# Patient Record
Sex: Female | Born: 1980 | Hispanic: No | Marital: Single | Smoking: Never smoker
Health system: Southern US, Community
[De-identification: ages and names within clinical notes are randomized; demographics above are authoritative.]

---

## 2016-06-21 ENCOUNTER — Emergency Department (HOSPITAL_COMMUNITY): Payer: Self-pay

## 2016-06-21 ENCOUNTER — Encounter (HOSPITAL_COMMUNITY): Payer: Self-pay | Admitting: *Deleted

## 2016-06-21 ENCOUNTER — Emergency Department (HOSPITAL_COMMUNITY)
Admission: EM | Admit: 2016-06-21 | Discharge: 2016-06-21 | Disposition: A | Discharge status: Home / Self Care | Payer: Self-pay | Attending: Emergency Medicine | Admitting: Emergency Medicine

## 2016-06-21 DIAGNOSIS — Z3A16 16 weeks gestation of pregnancy: Secondary | ICD-10-CM | POA: Insufficient documentation

## 2016-06-21 DIAGNOSIS — W1839XA Other fall on same level, initial encounter: Secondary | ICD-10-CM | POA: Insufficient documentation

## 2016-06-21 DIAGNOSIS — O9A212 Injury, poisoning and certain other consequences of external causes complicating pregnancy, second trimester: Secondary | ICD-10-CM | POA: Insufficient documentation

## 2016-06-21 DIAGNOSIS — Y999 Unspecified external cause status: Secondary | ICD-10-CM | POA: Insufficient documentation

## 2016-06-21 DIAGNOSIS — Y929 Unspecified place or not applicable: Secondary | ICD-10-CM | POA: Insufficient documentation

## 2016-06-21 DIAGNOSIS — S46911A Strain of unspecified muscle, fascia and tendon at shoulder and upper arm level, right arm, initial encounter: Secondary | ICD-10-CM

## 2016-06-21 DIAGNOSIS — Y939 Activity, unspecified: Secondary | ICD-10-CM | POA: Insufficient documentation

## 2016-06-21 DIAGNOSIS — S46811A Strain of other muscles, fascia and tendons at shoulder and upper arm level, right arm, initial encounter: Secondary | ICD-10-CM | POA: Insufficient documentation

## 2016-06-21 LAB — PREGNANCY, URINE: PREG TEST UR: POSITIVE — AB

## 2016-06-21 IMAGING — CR DG SHOULDER 2+V*R*
4 series · 4 of 4 positions shown · non-contrast
Comparison: None.

CLINICAL DATA: Right shoulder injury with pain.  Initial encounter.

EXAM:
RIGHT SHOULDER - 2+ VIEW

[w shoulder external right]
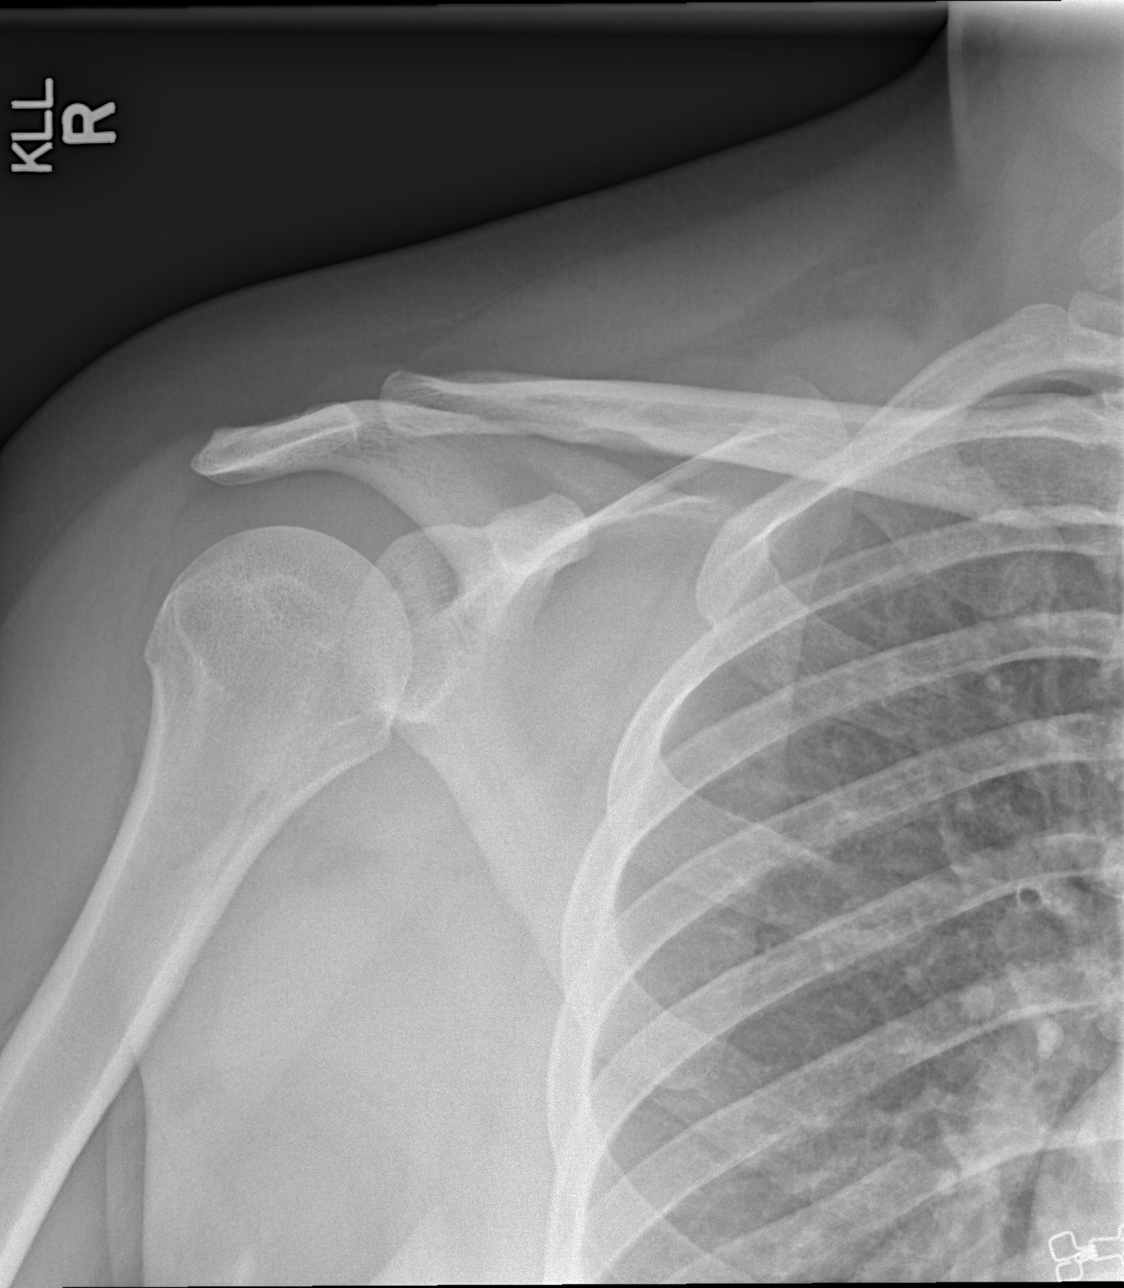

[w shoulder y-view right (1 of 2)]
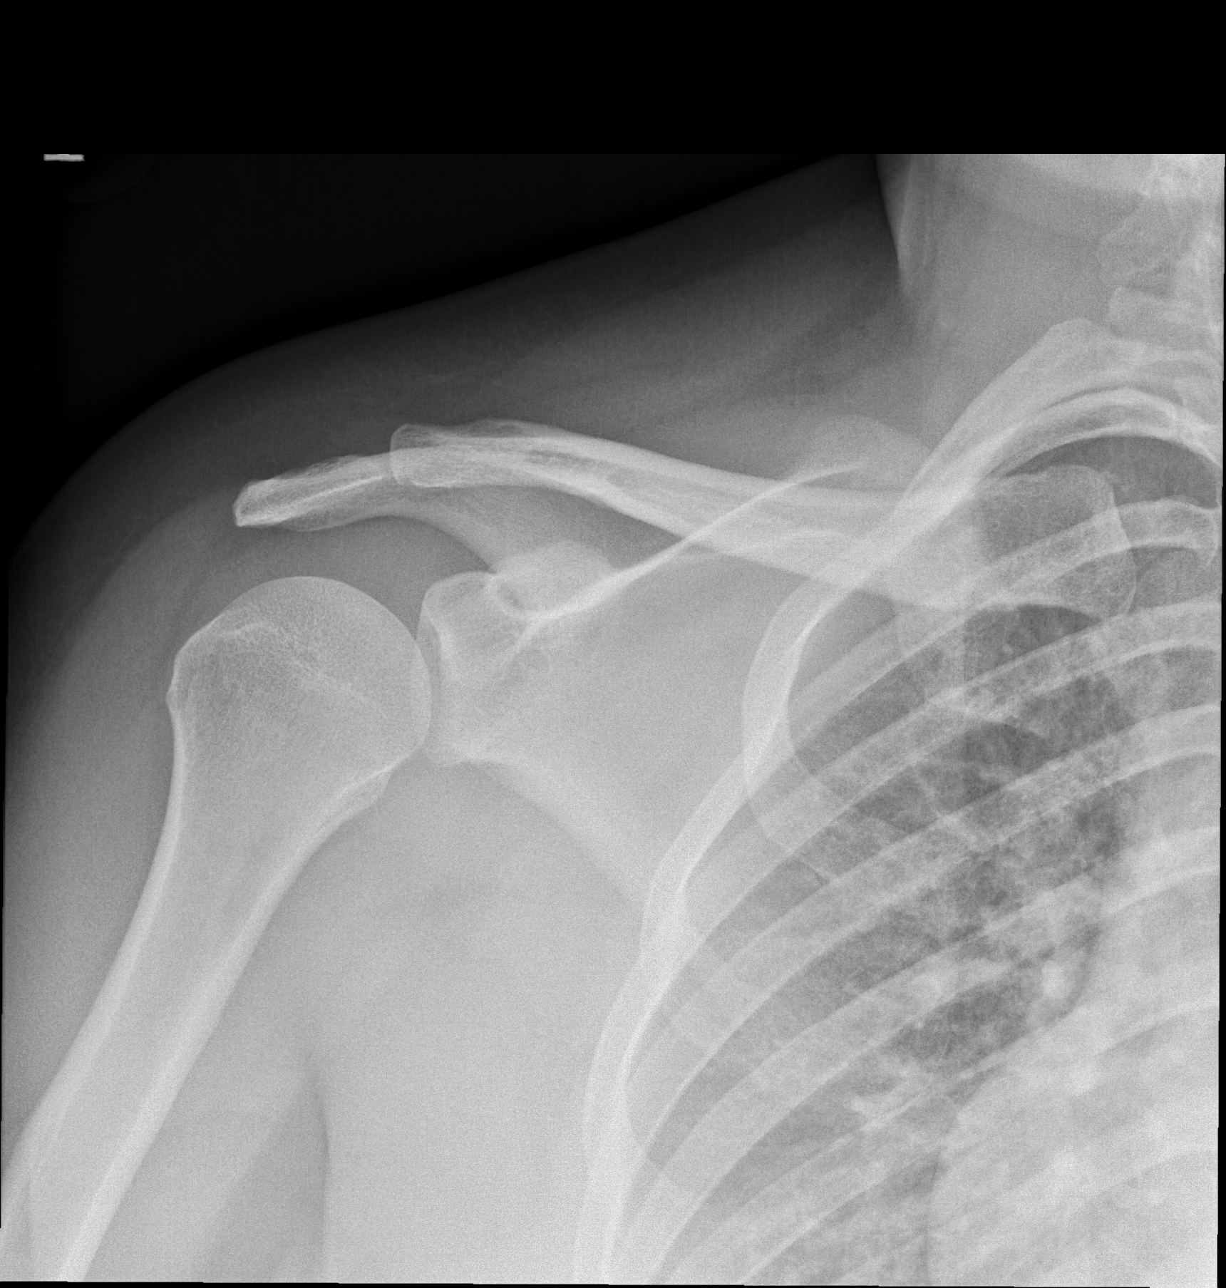

[w shoulder y-view right (2 of 2)]
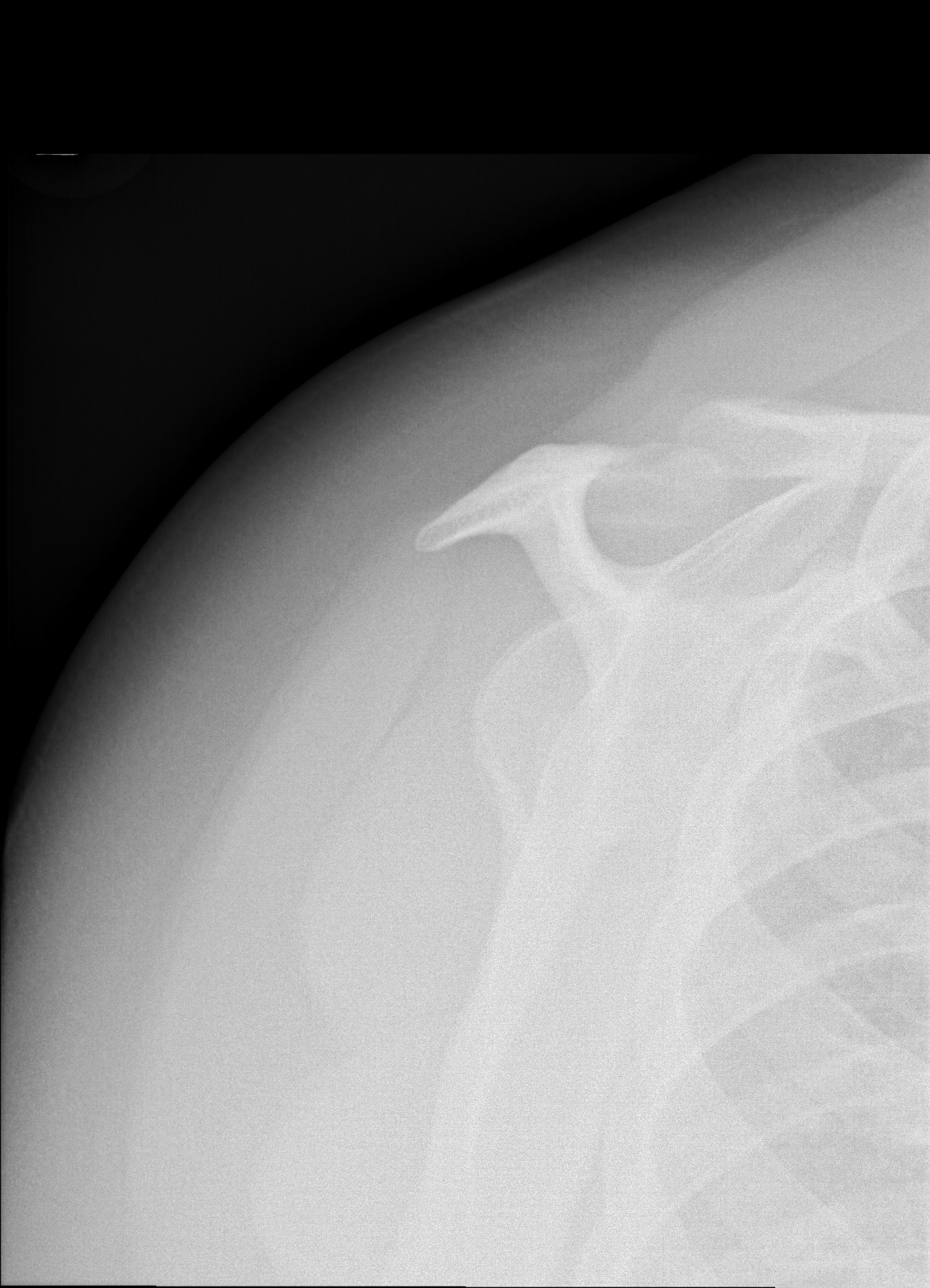

[x shoulder axillary right]
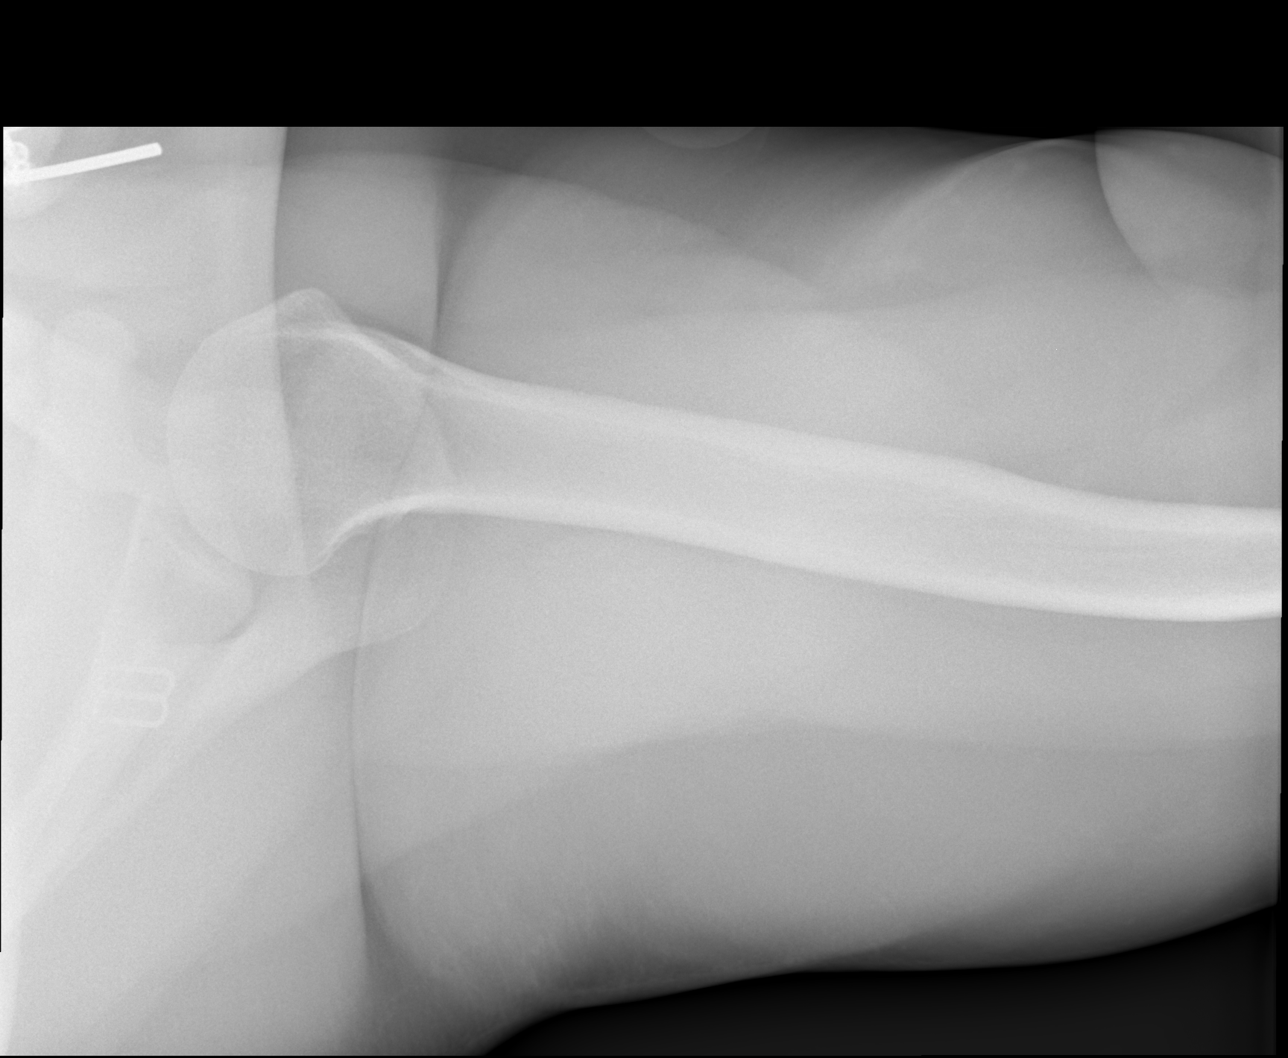

[4 of 4 positions shown; findings below may reference images not displayed]

FINDINGS: There is no evidence of fracture or dislocation. There is no
evidence of arthropathy or other focal bone abnormality. Soft
tissues are unremarkable.
IMPRESSION: Negative.

## 2016-06-21 MED ORDER — ACETAMINOPHEN 325 MG PO TABS
650.0000 mg | ORAL_TABLET | Freq: Once | ORAL | Status: AC
  Administered 2016-06-21: 650 mg via ORAL
  Filled 2016-06-21: qty 2

## 2016-06-21 NOTE — Discharge Instructions (Signed)
Muscle Strain °A muscle strain is an injury that occurs when a muscle is stretched beyond its normal length. Usually a small number of muscle fibers are torn when this happens. Muscle strain is rated in degrees. First-degree strains have the least amount of muscle fiber tearing and pain. Second-degree and third-degree strains have increasingly more tearing and pain.  °Usually, recovery from muscle strain takes 1-2 weeks. Complete healing takes 5-6 weeks.  °CAUSES  °Muscle strain happens when a sudden, violent force placed on a muscle stretches it too far. This may occur with lifting, sports, or a fall.  °RISK FACTORS °Muscle strain is especially common in athletes.  °SIGNS AND SYMPTOMS °At the site of the muscle strain, there may be: °· Pain. °· Bruising. °· Swelling. °· Difficulty using the muscle due to pain or lack of normal function. °DIAGNOSIS  °Your health care provider will perform a physical exam and ask about your medical history. °TREATMENT  °Often, the best treatment for a muscle strain is resting, icing, and applying cold compresses to the injured area.   °HOME CARE INSTRUCTIONS  °· Use the PRICE method of treatment to promote muscle healing during the first 2-3 days after your injury. The PRICE method involves: °· Protecting the muscle from being injured again. °· Restricting your activity and resting the injured body part. °· Icing your injury. To do this, put ice in a plastic bag. Place a towel between your skin and the bag. Then, apply the ice and leave it on from 15-20 minutes each hour. After the third day, switch to moist heat packs. °· Apply compression to the injured area with a splint or elastic bandage. Be careful not to wrap it too tightly. This may interfere with blood circulation or increase swelling. °· Elevate the injured body part above the level of your heart as often as you can. °· Only take over-the-counter or prescription medicines for pain, discomfort, or fever as directed by your  health care provider. °· Warming up prior to exercise helps to prevent future muscle strains. °SEEK MEDICAL CARE IF:  °· You have increasing pain or swelling in the injured area. °· You have numbness, tingling, or a significant loss of strength in the injured area. °MAKE SURE YOU:  °· Understand these instructions. °· Will watch your condition. °· Will get help right away if you are not doing well or get worse. °  °This information is not intended to replace advice given to you by your health care provider. Make sure you discuss any questions you have with your health care provider. °  °Document Released: 11/19/2005 Document Revised: 09/09/2013 Document Reviewed: 06/18/2013 °Elsevier Interactive Patient Education ©2016 [employer] Inc[employer] ° °Cryotherapy °Cryotherapy means treatment with cold. Ice or gel packs can be used to reduce both pain and swelling. Ice is the most helpful within the first 24 to 48 hours after an injury or flare-up from overusing a muscle or joint. Sprains, strains, spasms, burning pain, shooting pain, and aches can all be eased with ice. Ice can also be used when recovering from surgery. Ice is effective, has very few side effects, and is safe for most people to use. °PRECAUTIONS  °Ice is not a safe treatment option for people with: °· Raynaud phenomenon. This is a condition affecting small blood vessels in the extremities. Exposure to cold may cause your problems to return. °· Cold hypersensitivity. There are many forms of cold hypersensitivity, including: °¨ Cold urticaria. Red, itchy hives appear on the skin when the   tissues begin to warm after being iced. °¨ Cold erythema. This is a red, itchy rash caused by exposure to cold. °¨ Cold hemoglobinuria. Red blood cells break down when the tissues begin to warm after being iced. The hemoglobin that carry oxygen are passed into the urine because they cannot combine with blood proteins fast enough. °· Numbness or altered sensitivity in the area being  iced. °If you have any of the following conditions, do not use ice until you have discussed cryotherapy with your caregiver: °· Heart conditions, such as arrhythmia, angina, or chronic heart disease. °· High blood pressure. °· Healing wounds or open skin in the area being iced. °· Current infections. °· Rheumatoid arthritis. °· Poor circulation. °· Diabetes. °Ice slows the blood flow in the region it is applied. This is beneficial when trying to stop inflamed tissues from spreading irritating chemicals to surrounding tissues. However, if you expose your skin to cold temperatures for too long or without the proper protection, you can damage your skin or nerves. Watch for signs of skin damage due to cold. °HOME CARE INSTRUCTIONS °Follow these tips to use ice and cold packs safely. °· Place a dry or damp towel between the ice and skin. A damp towel will cool the skin more quickly, so you may need to shorten the time that the ice is used. °· For a more rapid response, add gentle compression to the ice. °· Ice for no more than 10 to 20 minutes at a time. The bonier the area you are icing, the less time it will take to get the benefits of ice. °· Check your skin after 5 minutes to make sure there are no signs of a poor response to cold or skin damage. °· Rest 20 minutes or more between uses. °· Once your skin is numb, you can end your treatment. You can test numbness by very lightly touching your skin. The touch should be so light that you do not see the skin dimple from the pressure of your fingertip. When using ice, most people will feel these normal sensations in this order: cold, burning, aching, and numbness. °· Do not use ice on someone who cannot communicate their responses to pain, such as small children or people with dementia. °HOW TO MAKE AN ICE PACK °Ice packs are the most common way to use ice therapy. Other methods include ice massage, ice baths, and cryosprays. Muscle creams that cause a cold, tingly  feeling do not offer the same benefits that ice offers and should not be used as a substitute unless recommended by your caregiver. °To make an ice pack, do one of the following: °· Place crushed ice or a bag of frozen vegetables in a sealable plastic bag. Squeeze out the excess air. Place this bag inside another plastic bag. Slide the bag into a pillowcase or place a damp towel between your skin and the bag. °· Mix 3 parts water with 1 part rubbing alcohol. Freeze the mixture in a sealable plastic bag. When you remove the mixture from the freezer, it will be slushy. Squeeze out the excess air. Place this bag inside another plastic bag. Slide the bag into a pillowcase or place a damp towel between your skin and the bag. °SEEK MEDICAL CARE IF: °· You develop white spots on your skin. This may give the skin a blotchy (mottled) appearance. °· Your skin turns blue or pale. °· Your skin becomes waxy or hard. °· Your swelling gets worse. °MAKE SURE   YOU:  °· Understand these instructions. °· Will watch your condition. °· Will get help right away if you are not doing well or get worse. °  °This information is not intended to replace advice given to you by your health care provider. Make sure you discuss any questions you have with your health care provider. °  °Document Released: 07/16/2011 Document Revised: 12/10/2014 Document Reviewed: 07/16/2011 °Elsevier Interactive Patient Education ©2016 Elsevier Inc. ° °

## 2016-06-21 NOTE — ED Notes (Signed)
Pt states that her laundry room ceiling drywall collapsed and struck her in the wet; pt states that the drywall was wet and heavy; pt denies LOC; pt denies neck or back pain; pt c/o headache and states that sound feels amplified; pt c/o rt shoulder discomfort; pt states that she fell against the washer and felt a sharp pain in her abdomen; pt denies abdominal pain currently but states "I think I may be pregnant so I was concerned"; denies vaginal bleeding

## 2016-06-21 NOTE — ED Provider Notes (Signed)
CSN: [id number]     Arrival date & time 06/21/16  1915 History   First MD Initiated Contact with Patient 06/21/16 2051     Chief Complaint  Patient presents with  . Head Injury     (Consider location/radiation/quality/duration/timing/severity/associated sxs/prior Treatment) HPI Comments: 35 year old female presents complaining of right-sided head pain after a piece of drywall fell onto her. Denies any loss of consciousness. No blurred vision. Does complain of some sharp right-sided neck pain is positional without radiation to her right arm. Last menstrual period was in March and she is concerned that she may be pregnant. Did have some transient abdominal discomfort after she fell against a washer that is since subsided. No treatment use prior to arrival. Denies any abdominal cramping or vaginal bleeding. Continues to endorse right shoulder pain is worse with movement better with rest  Patient is a 35 y.o. female presenting with head injury. The history is provided by the patient.  Head Injury   History reviewed. No pertinent past medical history. Past Surgical History  Procedure Laterality Date  . Cesarean section     No family history on file. Social History  Substance Use Topics  . Smoking status: Never Smoker   . Smokeless tobacco: None  . Alcohol Use: No   OB History    No data available     Review of Systems  All other systems reviewed and are negative.     Allergies  Asa  Home Medications   Prior to Admission medications   Not on File   BP 119/75 mmHg  Pulse 83  Temp(Src) 98.2 F (36.8 C) (Oral)  Resp 18  Ht 5\' 3"  (1.6 m)  Wt 129.275 kg  BMI 50.50 kg/m2  SpO2 99%  LMP 02/20/2016 Physical Exam  Constitutional: She is oriented to person, place, and time. She appears well-developed and well-nourished.  Non-toxic appearance. No distress.  HENT:  Head: Normocephalic and atraumatic.  Eyes: Conjunctivae, EOM and lids are normal. Pupils are equal, round, and  reactive to light.  Neck: Normal range of motion. Neck supple. No tracheal deviation present. No thyroid mass present.    Cardiovascular: Normal rate, regular rhythm and normal heart sounds.  Exam reveals no gallop.   No murmur heard. Pulmonary/Chest: Effort normal and breath sounds normal. No stridor. No respiratory distress. She has no decreased breath sounds. She has no wheezes. She has no rhonchi. She has no rales.  Abdominal: Soft. Normal appearance and bowel sounds are normal. She exhibits no distension. There is no tenderness. There is no rebound and no CVA tenderness.  Musculoskeletal: Normal range of motion. She exhibits no edema or tenderness.       Arms: Neurological: She is alert and oriented to person, place, and time. She has normal strength. No cranial nerve deficit or sensory deficit. GCS eye subscore is 4. GCS verbal subscore is 5. GCS motor subscore is 6.  Skin: Skin is warm and dry. No abrasion and no rash noted.  Psychiatric: She has a normal mood and affect. Her speech is normal and behavior is normal.  Nursing note and vitals reviewed.   ED Course  Procedures (including critical care time) Labs Review Labs Reviewed  PREGNANCY, URINE    Imaging Review No results found. I have personally reviewed and evaluated these images and lab results as part of my medical decision-making.   EKG Interpretation None      MDM   Final diagnoses:  None  Shoulder x-ray negative. Given Tylenol for  pain.    Lorre Nick, MD 06/21/16 2214

## 2016-10-22 ENCOUNTER — Encounter: Payer: Self-pay | Admitting: Obstetrics & Gynecology

## 2016-10-22 ENCOUNTER — Ambulatory Visit (INDEPENDENT_AMBULATORY_CARE_PROVIDER_SITE_OTHER): Payer: Self-pay | Admitting: Obstetrics & Gynecology

## 2016-10-22 VITALS — BP 136/80 | HR 82 | Wt 305.0 lb

## 2016-10-22 DIAGNOSIS — O99213 Obesity complicating pregnancy, third trimester: Secondary | ICD-10-CM

## 2016-10-22 DIAGNOSIS — O99210 Obesity complicating pregnancy, unspecified trimester: Secondary | ICD-10-CM

## 2016-10-22 DIAGNOSIS — O24913 Unspecified diabetes mellitus in pregnancy, third trimester: Secondary | ICD-10-CM

## 2016-10-22 DIAGNOSIS — O34219 Maternal care for unspecified type scar from previous cesarean delivery: Secondary | ICD-10-CM

## 2016-10-22 DIAGNOSIS — Z3493 Encounter for supervision of normal pregnancy, unspecified, third trimester: Secondary | ICD-10-CM

## 2016-10-22 DIAGNOSIS — Z113 Encounter for screening for infections with a predominantly sexual mode of transmission: Secondary | ICD-10-CM

## 2016-10-22 DIAGNOSIS — O24419 Gestational diabetes mellitus in pregnancy, unspecified control: Secondary | ICD-10-CM

## 2016-10-22 DIAGNOSIS — Z124 Encounter for screening for malignant neoplasm of cervix: Secondary | ICD-10-CM

## 2016-10-22 DIAGNOSIS — O0933 Supervision of pregnancy with insufficient antenatal care, third trimester: Secondary | ICD-10-CM

## 2016-10-22 DIAGNOSIS — Z1151 Encounter for screening for human papillomavirus (HPV): Secondary | ICD-10-CM

## 2016-10-22 LAB — POCT URINALYSIS DIP (DEVICE)
Bilirubin Urine: NEGATIVE
GLUCOSE, UA: NEGATIVE mg/dL
Hgb urine dipstick: NEGATIVE
Leukocytes, UA: NEGATIVE
Nitrite: NEGATIVE
PH: 6.5 (ref 5.0–8.0)
PROTEIN: NEGATIVE mg/dL
Specific Gravity, Urine: 1.025 (ref 1.005–1.030)
Urobilinogen, UA: 0.2 mg/dL (ref 0.0–1.0)

## 2016-10-22 NOTE — Progress Notes (Signed)
1 hr gtt @ 203

## 2016-10-22 NOTE — Patient Instructions (Signed)
AREA PEDIATRIC/FAMILY PRACTICE PHYSICIANS  [facility] [address], [address] [place], [place]  [address] Phone - [phone]   Fax - [phone]  [facility] [address] [place], [place] [address] Phone - [phone]   Fax - [phone]  [facility] [address] [place], [place]  [address] Phone - [phone]   Fax - [phone]  [facility] [address] [place], [place]  [address] Phone - [phone]   Fax - [phone]  [facility] [address] [place], [place]  [address] Phone - [phone]   Fax - [phone]  [facility] [address] [place], [place]  [address] Phone - [phone]   Fax - [phone]  [facility] [address] [place], [place]  [address] Phone - [phone]   Fax - [phone]  [facility] [address] [place], [place]  [address] Phone - [phone]   Fax - [phone]  [facility] [address] [place], [place]  [address] Phone - [phone]   Fax - [phone] [facility] [address] [place], [place]  [address] Phone - [phone]   Fax - [phone]  [facility] [address] [place], [place]  [address] Phone - [phone]   Fax - [phone]  [facility] [address] [place], [place]  [address] Phone - [phone]   Fax - [phone]  [facility] [address] [place], [place]  [address] Phone - [phone]   Fax - [phone]  [facility] [address] [place], [place]  [address] Phone - [phone]  [facility] [address] [place], [place]  [address] Phone - [phone]   Fax - [phone]  [facility] [address] [place], [place]  [address] Phone - [phone]   Fax - [phone]  [facility] [address] [place], [place]  [address] Phone - [phone]   Fax - [phone]  [facility] [address] [place], [place]  [address] Phone - [phone]   Fax - [phone]   [facility] [address] [place], [place]  [address] Phone - [phone]   Fax - [phone]  [facility] [address] [place], [place]  [address] Phone - [phone]   Fax - [phone]  [facility] [address] [place], [place]  [address] Phone - [phone]   Fax - [phone]  [facility] [address] , [place]  [address] Phone - [phone]   Fax - [phone]  [facility] [address] , [place]  [address] Phone - [phone]   Fax - [phone] [facility] [address] [place], [place]  [address] Phone - [phone]   Fax - [phone]  [facility] [address] [place], [place]  [address] Phone - [phone]   Fax - [phone]  [facility] [address] [place],   [address] Phone - [phone]   Fax - [phone]   

## 2016-10-22 NOTE — Progress Notes (Signed)
  Subjective:    Renee Dennis is a [id number] 2024w0d being seen today for her first obstetrical visit.  Her obstetrical history is significant for previous cesarean sections, obesity, late prenatal care. Patient does intend to breast feed. Pregnancy history fully reviewed.  Patient reports no complaints.  Vitals:   10/22/16 1259  BP: 136/80  Pulse: 82  Weight: (!) 138.3 kg (305 lb)    HISTORY: OB History  Gravida Para Term Preterm AB Living  5 3 3  0 1 3  SAB TAB Ectopic Multiple Live Births  1 0 0 0 3    # Outcome Date GA Lbr Len/2nd Weight Sex Delivery Anes PTL Lv  5 Current           4 Term 05/22/14 5773w0d  3.685 kg (8 lb 2 oz) M CS-Unspec Spinal N LIV  3 Term 12/15/08 858w0d  2.977 kg (6 lb 9 oz) F CS-Unspec Spinal N LIV  2 Term 12/15/03 138w0d  3.402 kg (7 lb 8 oz) M CS-Unspec EPI N LIV     Birth Comments: patient had c-section for NR FHR  1 SAB 03/2001             History reviewed. No pertinent past medical history. Past Surgical History:  Procedure Laterality Date  . CESAREAN SECTION     Family History  Problem Relation Age of Onset  . Diabetes Maternal Grandmother   . Stroke Maternal Grandfather   . Heart disease Maternal Grandfather      Exam    Uterus:     Pelvic Exam:    Perineum: No Hemorrhoids   Vulva: normal   Vagina:  normal mucosa   pH:    Cervix: no lesions   Adnexa: not evaluated   Bony Pelvis: average  System: Breast:     Skin: normal coloration and turgor, no rashes    Neurologic: oriented, normal mood   Extremities: normal strength, tone, and muscle mass   HEENT PERRLA   Mouth/Teeth mucous membranes moist, pharynx normal without lesions and dental hygiene good   Neck supple   Cardiovascular: regular rate and rhythm   Respiratory:  appears well, vitals normal, no respiratory distress, acyanotic, normal RR, neck free of mass or lymphadenopathy, chest clear, no wheezing, crepitations, rhonchi, normal symmetric air entry   Abdomen: obese,  gravid   Urinary: urethral meatus normal      Assessment:    Pregnancy: [id number] Patient Active Problem List   Diagnosis Date Noted  . Encounter for supervision of low-risk pregnancy in third trimester 10/22/2016  . Previous cesarean section complicating pregnancy 10/22/2016  . Maternal morbid obesity, antepartum (HCC) 10/22/2016        Plan:     Initial labs drawn. Prenatal vitamins. Problem list reviewed and updated. Genetic Screening too late  Ultrasound discussed; fetal survey: ordered.  Follow up in 2 weeks. 50% of 30 min visit spent on counseling and coordination of care.  1 hr GTT today Repeat CS at 39 weeks, wants BTL   Renee Dennis 10/22/2016

## 2016-10-23 LAB — PAIN MGMT, PROFILE 6 CONF W/O MM, U
6 ACETYLMORPHINE: NEGATIVE ng/mL (ref <10)
ALCOHOL METABOLITES: NEGATIVE ng/mL (ref <500)
Amphetamines: NEGATIVE ng/mL (ref <500)
Barbiturates: NEGATIVE ng/mL (ref <300)
Benzodiazepines: NEGATIVE ng/mL (ref <100)
COCAINE METABOLITE: NEGATIVE ng/mL (ref <150)
Creatinine: 195.3 mg/dL (ref >=20.0)
METHADONE METABOLITE: NEGATIVE ng/mL (ref <100)
Marijuana Metabolite: NEGATIVE ng/mL (ref <20)
OXYCODONE: NEGATIVE ng/mL (ref <100)
Opiates: NEGATIVE ng/mL (ref <100)
Oxidant: NEGATIVE ug/mL (ref <200)
PH: 6.87 (ref 4.5–9.0)
PHENCYCLIDINE: NEGATIVE ng/mL (ref <25)
PLEASE NOTE: 0

## 2016-10-23 LAB — CULTURE, OB URINE

## 2016-10-23 LAB — PRENATAL PROFILE (SOLSTAS)
ANTIBODY SCREEN: NEGATIVE
Basophils Absolute: 0 cells/uL (ref 0–200)
Basophils Relative: 0 %
EOS PCT: 1 %
Eosinophils Absolute: 84 cells/uL (ref 15–500)
HEMATOCRIT: 33.5 % — AB (ref 35.0–45.0)
HEMOGLOBIN: 10.8 g/dL — AB (ref 11.7–15.5)
HEP B S AG: NEGATIVE
HIV 1&2 Ab, 4th Generation: NONREACTIVE
LYMPHS ABS: 2436 {cells}/uL (ref 850–3900)
LYMPHS PCT: 29 %
MCH: 24.5 pg — ABNORMAL LOW (ref 27.0–33.0)
MCHC: 32.2 g/dL (ref 32.0–36.0)
MCV: 76 fL — ABNORMAL LOW (ref 80.0–100.0)
MPV: 11.1 fL (ref 7.5–12.5)
Monocytes Absolute: 1008 cells/uL — ABNORMAL HIGH (ref 200–950)
Monocytes Relative: 12 %
NEUTROS PCT: 58 %
Neutro Abs: 4872 cells/uL (ref 1500–7800)
Platelets: 274 10*3/uL (ref 140–400)
RBC: 4.41 MIL/uL (ref 3.80–5.10)
RDW: 16 % — ABNORMAL HIGH (ref 11.0–15.0)
RH TYPE: POSITIVE
Rubella: 2.66 Index — ABNORMAL HIGH (ref <0.90)
WBC: 8.4 10*3/uL (ref 3.8–10.8)

## 2016-10-23 LAB — GLUCOSE TOLERANCE, 1 HOUR (50G) W/O FASTING: GLUCOSE, 1 HR, GESTATIONAL: 85 mg/dL (ref <140)

## 2016-10-24 LAB — CYTOLOGY - PAP
CHLAMYDIA, DNA PROBE: NEGATIVE
DIAGNOSIS: NEGATIVE
HPV: NOT DETECTED
NEISSERIA GONORRHEA: NEGATIVE

## 2016-10-26 LAB — HEMOGLOBINOPATHY EVALUATION
HCT: 33.5 % — ABNORMAL LOW (ref 35.0–45.0)
HGB A: 97 % (ref >96.0)
Hemoglobin: 10.8 g/dL — ABNORMAL LOW (ref 11.7–15.5)
Hgb A2 Quant: 2 % (ref 1.8–3.5)
MCH: 24.5 pg — ABNORMAL LOW (ref 27.0–33.0)
MCV: 76 fL — ABNORMAL LOW (ref 80.0–100.0)
RBC: 4.41 MIL/uL (ref 3.80–5.10)
RDW: 16 % — ABNORMAL HIGH (ref 11.0–15.0)

## 2016-10-29 ENCOUNTER — Ambulatory Visit (HOSPITAL_COMMUNITY)
Admission: RE | Admit: 2016-10-29 | Discharge: 2016-10-29 | Disposition: A | Discharge status: Home / Self Care | Payer: Self-pay | Source: Ambulatory Visit | Attending: Obstetrics & Gynecology | Admitting: Obstetrics & Gynecology

## 2016-10-29 ENCOUNTER — Other Ambulatory Visit: Payer: Self-pay | Admitting: Obstetrics & Gynecology

## 2016-10-29 DIAGNOSIS — Z3A35 35 weeks gestation of pregnancy: Secondary | ICD-10-CM

## 2016-10-29 DIAGNOSIS — O0933 Supervision of pregnancy with insufficient antenatal care, third trimester: Secondary | ICD-10-CM

## 2016-10-29 DIAGNOSIS — O34211 Maternal care for low transverse scar from previous cesarean delivery: Secondary | ICD-10-CM | POA: Insufficient documentation

## 2016-10-29 DIAGNOSIS — O34219 Maternal care for unspecified type scar from previous cesarean delivery: Secondary | ICD-10-CM

## 2016-10-29 DIAGNOSIS — Z363 Encounter for antenatal screening for malformations: Secondary | ICD-10-CM | POA: Insufficient documentation

## 2016-10-29 DIAGNOSIS — Z3493 Encounter for supervision of normal pregnancy, unspecified, third trimester: Secondary | ICD-10-CM

## 2016-10-29 DIAGNOSIS — Z3687 Encounter for antenatal screening for uncertain dates: Secondary | ICD-10-CM | POA: Insufficient documentation

## 2016-10-29 IMAGING — US US MFM OB COMP +14 WKS
1 series · 14 of 28 positions shown · non-contrast
Comparison: none

[Series 1: us mfm ob comp +14 wks · 75 acquisitions, 14 frames shown]
[im 3/75]
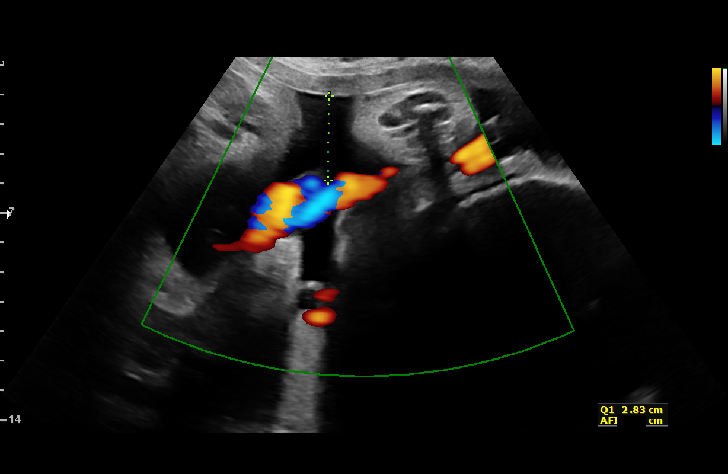
[im 9/75]
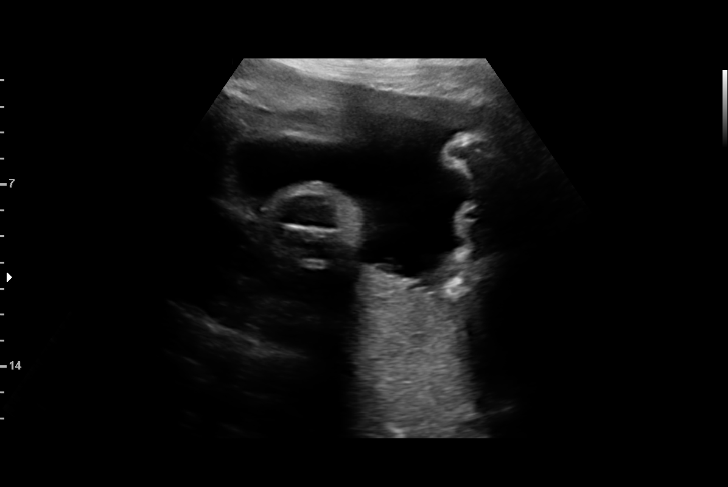
[im 14/75]
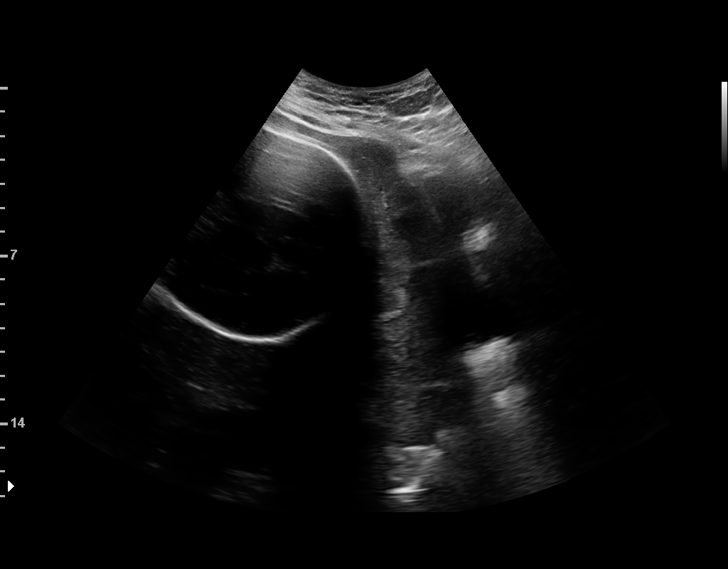
[im 20/75]
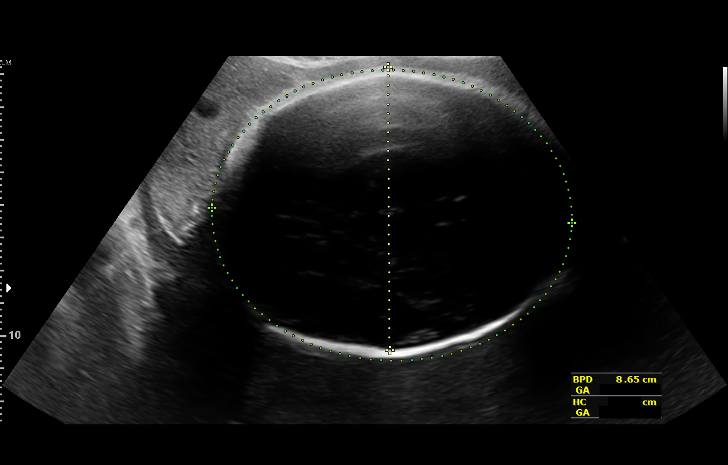
[im 25/75]
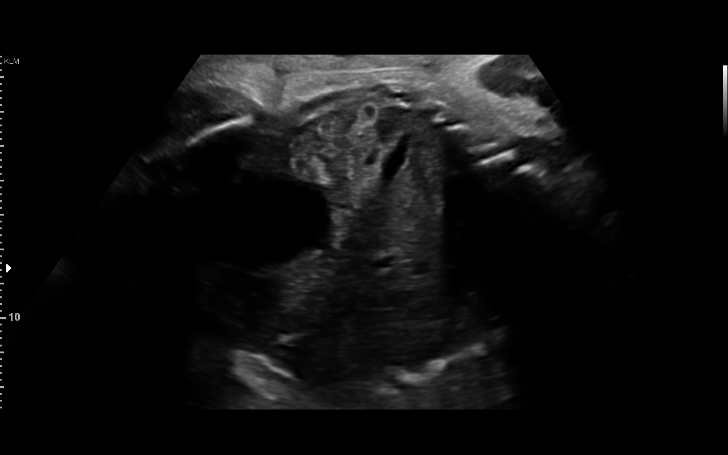
[im 31/75]
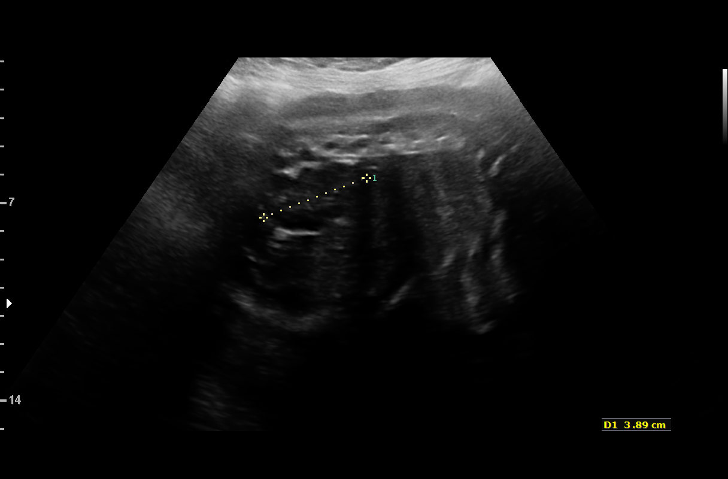
[im 36/75]
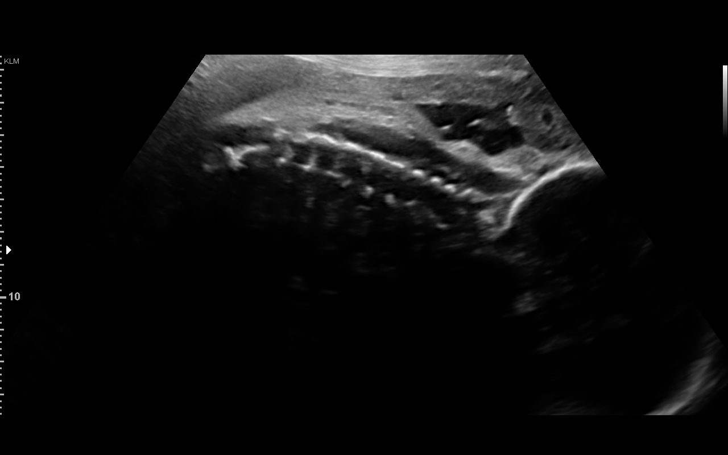
[im 42/75]
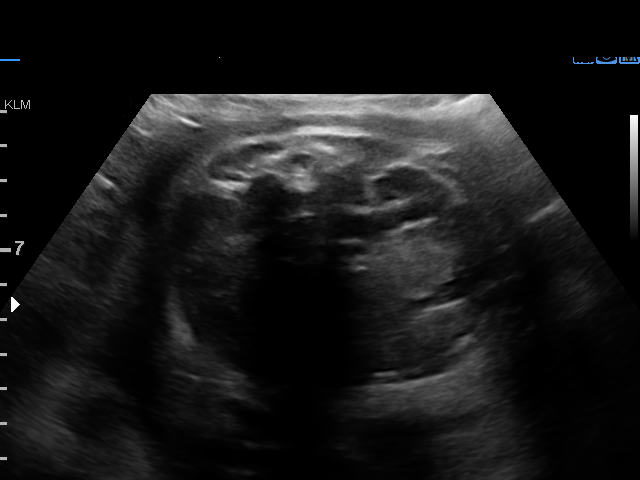
[im 47/75]
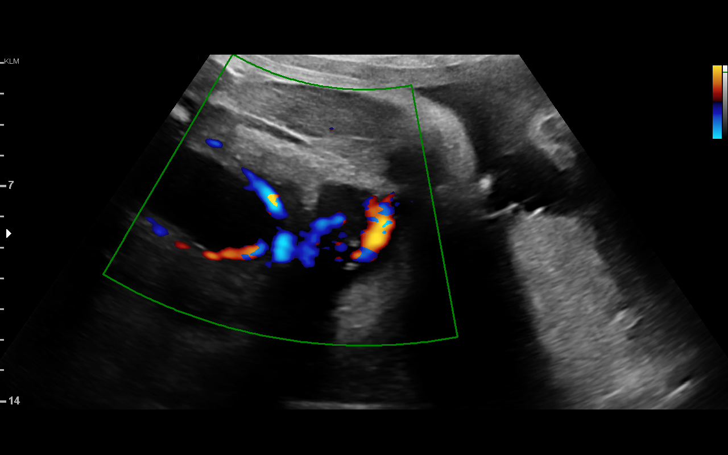
[im 53/75]
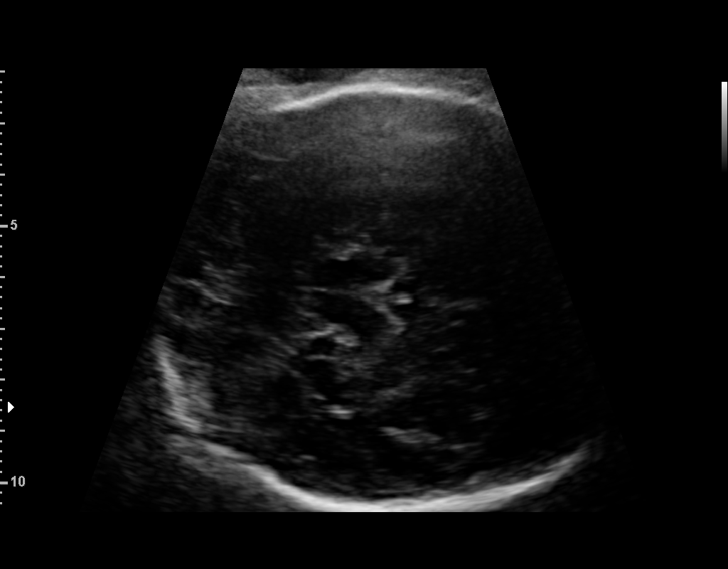
[im 58/75]
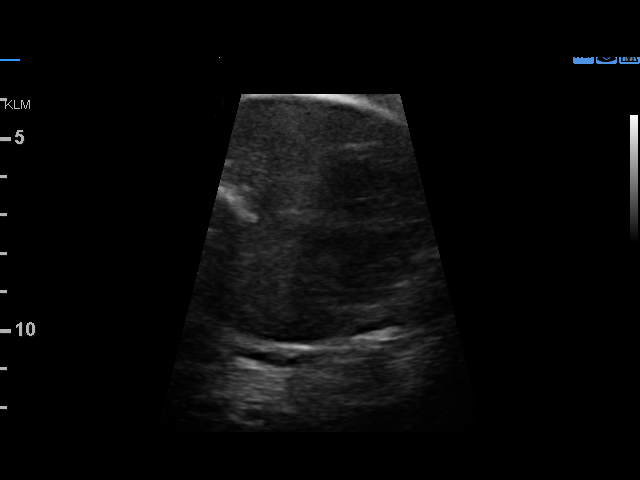
[im 64/75]
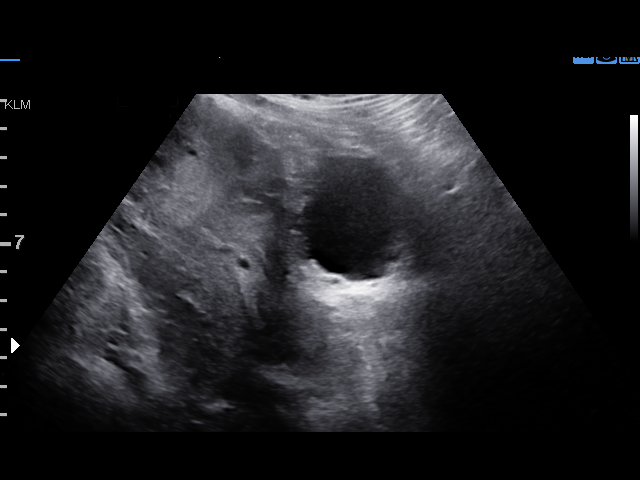
[im 69/75]
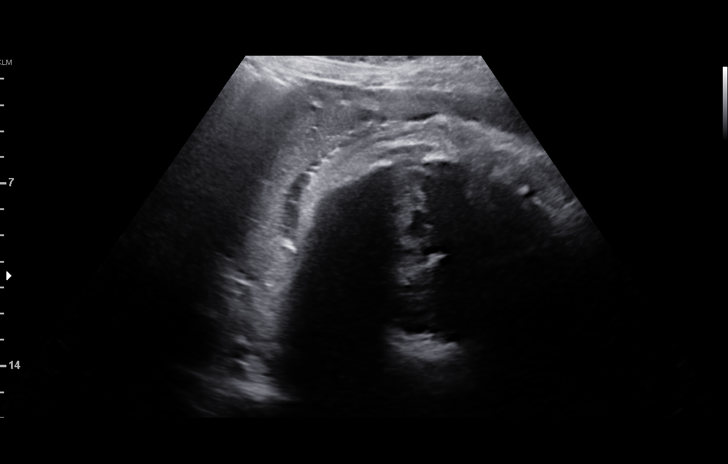
[im 75/75]
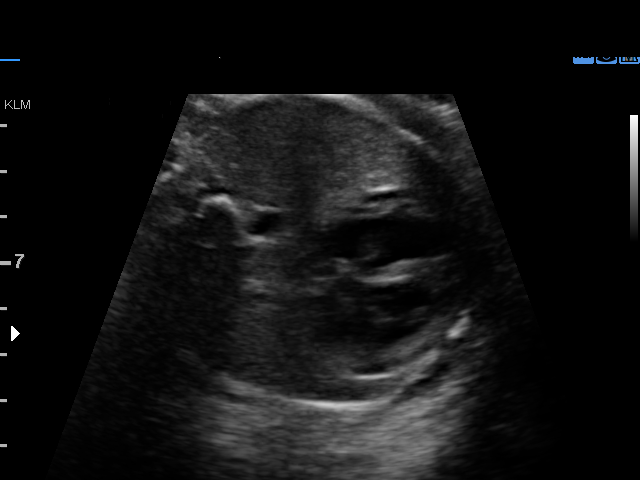

[14 of 28 positions shown; findings below may reference images not displayed]

1  KINSEY TORRE             [id number]      [id number]     [id number]
Indications

35 weeks gestation of pregnancy
Encounter for antenatal screening for
malformations
Late to prenatal care, third trimester
Encounter for uncertain dates
Previous cesarean delivery, antepartum x 3
OB History

Gravidity:    5         Term:   3        Prem:   0        SAB:   1
TOP:          0       Ectopic:  0        Living: 3
Fetal Evaluation

Num Of Fetuses:     1
Fetal Heart         154
Rate(bpm):
Cardiac Activity:   Observed
Presentation:       Cephalic
Placenta:           Posterior Previa
P. Cord Insertion:  Not well visualized

Amniotic Fluid
AFI FV:      Subjectively within normal limits

AFI Sum(cm)     %Tile       Largest Pocket(cm)
12.49           40

RUQ(cm)       RLQ(cm)       LUQ(cm)        LLQ(cm)
2.83
Biometry

BPD:        85  mm     G. Age:  34w 1d         19  %    CI:        73.02   %    70 - 86
FL/HC:      21.7   %    20.1 -
HC:      316.2  mm     G. Age:  35w 4d         19  %    HC/AC:      0.95        0.93 -
AC:      332.4  mm     G. Age:  37w 1d         92  %    FL/BPD:     80.6   %    71 - 87
FL:       68.5  mm     G. Age:  35w 1d         34  %    FL/AC:      20.6   %    20 - 24

Est. FW:    9989  gm      6 lb 5 oz     75  %
Gestational Age

LMP:           34w 0d        Date:  03/05/16                 EDD:   12/10/16
U/S Today:     35w 4d                                        EDD:   11/29/16
Best:          35w 4d     Det. By:  U/S (10/29/16)           EDD:   11/29/16
Anatomy

Cranium:               Appears normal         Aortic Arch:            Not well visualized
Cavum:                 Appears normal         Ductal Arch:            Not well visualized
Ventricles:            Appears normal         Diaphragm:              Appears normal
Choroid Plexus:        Appears normal         Stomach:                Appears normal, left
sided
Cerebellum:            Appears normal         Abdomen:                Appears normal
Posterior Fossa:       Appears normal         Abdominal Wall:         Not well visualized
Nuchal Fold:           Not applicable (>20    Cord Vessels:           Appears normal (3
wks GA)                                        vessel cord)
Face:                  Profile nl; orbits not Kidneys:                Appear normal
well visualized
Lips:                  Appears normal         Bladder:                Appears normal
Thoracic:              Appears normal         Spine:                  Appears normal
Heart:                 Not well visualized    Upper Extremities:      Not well visualized
RVOT:                  Appears normal         Lower Extremities:      Not well visualized
LVOT:                  Appears normal

Other:  Fetus appears to be a male. Technically difficult due to advanced GA
and fetal position.
Cervix Uterus Adnexa

Cervix
Not visualized (advanced GA >13wks)
Impression

Single IUP at 35w 4d
Late prenatal care, uncertain dates
Somewhat limited views of the fetal anatomy were obtained
due to late gestational age and fetal position
The estimated fetal weight is at the 75th %tile
A complete, posterior placenta previa is noted
Normal amniotic fluid volume
Recommendations

Consider Ceserean delivery at 36-37 weeks due to complete
placenta previa (or earlier based on the clinical scenario)
Would offer a course of late preterm [place] given
uncertain dates

## 2016-11-06 ENCOUNTER — Ambulatory Visit (INDEPENDENT_AMBULATORY_CARE_PROVIDER_SITE_OTHER): Payer: Self-pay | Admitting: Obstetrics and Gynecology

## 2016-11-06 VITALS — BP 132/78 | HR 84 | Wt 308.0 lb

## 2016-11-06 DIAGNOSIS — O99213 Obesity complicating pregnancy, third trimester: Secondary | ICD-10-CM

## 2016-11-06 DIAGNOSIS — O99210 Obesity complicating pregnancy, unspecified trimester: Secondary | ICD-10-CM

## 2016-11-06 DIAGNOSIS — O24913 Unspecified diabetes mellitus in pregnancy, third trimester: Secondary | ICD-10-CM

## 2016-11-06 DIAGNOSIS — O34219 Maternal care for unspecified type scar from previous cesarean delivery: Secondary | ICD-10-CM

## 2016-11-06 DIAGNOSIS — O4403 Placenta previa specified as without hemorrhage, third trimester: Secondary | ICD-10-CM

## 2016-11-06 DIAGNOSIS — Z3493 Encounter for supervision of normal pregnancy, unspecified, third trimester: Secondary | ICD-10-CM

## 2016-11-06 DIAGNOSIS — O4400 Placenta previa specified as without hemorrhage, unspecified trimester: Secondary | ICD-10-CM

## 2016-11-06 MED ORDER — BETAMETHASONE SOD PHOS & ACET 6 (3-3) MG/ML IJ SUSP
12.0000 mg | Freq: Every day | INTRAMUSCULAR | Status: AC
  Administered 2016-11-06: 12 mg via INTRAMUSCULAR

## 2016-11-06 NOTE — Progress Notes (Signed)
   PRENATAL VISIT NOTE  Subjective:  Renee Dennis is a 35 y.o. [id number] at 554w1d being seen today for ongoing prenatal care.  She is currently monitored for the following issues for this high-risk pregnancy and has Encounter for supervision of low-risk pregnancy in third trimester; Previous cesarean section complicating pregnancy; Maternal morbid obesity, antepartum (HCC); and Placenta previa antepartum on her problem list.  Patient reports no complaints.  Contractions: Irritability. Vag. Bleeding: None.  Movement: Present. Denies leaking of fluid.   The following portions of the patient's history were reviewed and updated as appropriate: allergies, current medications, past family history, past medical history, past social history, past surgical history and problem list. Problem list updated.  Objective:   Vitals:   11/06/16 1538  BP: 132/78  Pulse: 84  Weight: (!) 308 lb (139.7 kg)    Fetal Status: Fetal Heart Rate (bpm): 138 Fundal Height: 35 cm Movement: Present     General:  Alert, oriented and cooperative. Patient is in no acute distress.  Skin: Skin is warm and dry. No rash noted.   Cardiovascular: Normal heart rate noted  Respiratory: Normal respiratory effort, no problems with respiration noted  Abdomen: Soft, gravid, appropriate for gestational age. Pain/Pressure: Present     Pelvic:  Cervical exam deferred        Extremities: Normal range of motion.  Edema: None  Mental Status: Normal mood and affect. Normal behavior. Normal judgment and thought content.   Assessment and Plan:  Pregnancy: A5W0981G5P3013 at 184w1d  1. Encounter for supervision of low-risk pregnancy in third trimester Patient is doing well without complaints Results of anatomy ultrasound reviewed with the patient. Patient informed of posterior placenta previa. Pelvic rest precautions discussed.  Will plan for delivery at 36-37 weeks as recommended. BMZ today and tomorrow  2. Maternal morbid obesity, antepartum  (HCC)   3. Previous cesarean section complicating pregnancy Will be scheduled for repeat  4. Placenta previa antepartum Bleeding precautions also reviewed  Preterm labor symptoms and general obstetric precautions including but not limited to vaginal bleeding, contractions, leaking of fluid and fetal movement were reviewed in detail with the patient. Please refer to After Visit Summary for other counseling recommendations.  Return in about 1 week (around 11/13/2016).   Catalina AntiguaPeggy Kenyatte Gruber, MD

## 2016-11-06 NOTE — Addendum Note (Signed)
Addended by: Sherre LainASH, AMANDA A on: 11/06/2016 04:25 PM   Modules accepted: Orders

## 2016-11-07 ENCOUNTER — Ambulatory Visit (INDEPENDENT_AMBULATORY_CARE_PROVIDER_SITE_OTHER): Payer: Self-pay

## 2016-11-07 ENCOUNTER — Encounter (HOSPITAL_COMMUNITY): Payer: Self-pay | Admitting: *Deleted

## 2016-11-07 DIAGNOSIS — O4403 Placenta previa specified as without hemorrhage, third trimester: Secondary | ICD-10-CM

## 2016-11-07 DIAGNOSIS — O09213 Supervision of pregnancy with history of pre-term labor, third trimester: Secondary | ICD-10-CM

## 2016-11-07 MED ORDER — BETAMETHASONE SOD PHOS & ACET 6 (3-3) MG/ML IJ SUSP
12.0000 mg | Freq: Once | INTRAMUSCULAR | Status: AC
  Administered 2016-11-07: 12 mg via INTRAMUSCULAR

## 2016-11-07 NOTE — Progress Notes (Signed)
Pt here today for 2nd dose of betamethasone.  Pt tolerated well. Pt has no questions at this time.

## 2016-11-08 ENCOUNTER — Ambulatory Visit: Payer: Self-pay

## 2016-11-12 ENCOUNTER — Encounter (HOSPITAL_COMMUNITY): Payer: Self-pay

## 2016-11-16 ENCOUNTER — Ambulatory Visit (INDEPENDENT_AMBULATORY_CARE_PROVIDER_SITE_OTHER): Payer: Self-pay | Admitting: Obstetrics and Gynecology

## 2016-11-16 VITALS — BP 120/75 | HR 86 | Wt 309.6 lb

## 2016-11-16 DIAGNOSIS — O99213 Obesity complicating pregnancy, third trimester: Secondary | ICD-10-CM

## 2016-11-16 DIAGNOSIS — O4403 Placenta previa specified as without hemorrhage, third trimester: Secondary | ICD-10-CM

## 2016-11-16 DIAGNOSIS — O34219 Maternal care for unspecified type scar from previous cesarean delivery: Secondary | ICD-10-CM

## 2016-11-16 DIAGNOSIS — O99210 Obesity complicating pregnancy, unspecified trimester: Secondary | ICD-10-CM

## 2016-11-16 DIAGNOSIS — O4400 Placenta previa specified as without hemorrhage, unspecified trimester: Secondary | ICD-10-CM

## 2016-11-16 DIAGNOSIS — O24913 Unspecified diabetes mellitus in pregnancy, third trimester: Secondary | ICD-10-CM

## 2016-11-16 DIAGNOSIS — Z113 Encounter for screening for infections with a predominantly sexual mode of transmission: Secondary | ICD-10-CM

## 2016-11-16 DIAGNOSIS — Z3493 Encounter for supervision of normal pregnancy, unspecified, third trimester: Secondary | ICD-10-CM

## 2016-11-16 NOTE — Progress Notes (Signed)
   PRENATAL VISIT NOTE  Subjective:  Renee Dennis is a 35 y.o. [id number] at 547w4d being seen today for ongoing prenatal care.  She is currently monitored for the following issues for this high-risk pregnancy and has Encounter for supervision of low-risk pregnancy in third trimester; Previous cesarean section complicating pregnancy; Maternal morbid obesity, antepartum (HCC); and Placenta previa antepartum on her problem list.  Patient reports some vaginal bleeding overnight but it has stopped. She describes noting dark blood when she wipes.  Contractions: Irritability.  .  Movement: Present. Denies leaking of fluid.   The following portions of the patient's history were reviewed and updated as appropriate: allergies, current medications, past family history, past medical history, past social history, past surgical history and problem list. Problem list updated.  Objective:   Vitals:   11/16/16 0910  BP: 120/75  Pulse: 86  Weight: (!) 309 lb 9.6 oz (140.4 kg)    Fetal Status: Fetal Heart Rate (bpm): 131 Fundal Height: 36 cm Movement: Present     General:  Alert, oriented and cooperative. Patient is in no acute distress.  Skin: Skin is warm and dry. No rash noted.   Cardiovascular: Normal heart rate noted  Respiratory: Normal respiratory effort, no problems with respiration noted  Abdomen: Soft, gravid, appropriate for gestational age. Pain/Pressure: Present     Pelvic:  Cervical exam deferred      No active bleeding on SSE. No blood in vault  Extremities: Normal range of motion.     Mental Status: Normal mood and affect. Normal behavior. Normal judgment and thought content.   Assessment and Plan:  Pregnancy: P2R5188G5P3013 at 5947w4d  1. Encounter for supervision of low-risk pregnancy in third trimester Bleeding precautions reviewed with the patient Cultures collected - Culture, beta strep (group b only) - GC/Chlamydia probe amp ([place])[facility]  2. Placenta previa  antepartum Patient scheduled for repeat c-section on 12/19  3. Previous cesarean section complicating pregnancy   4. Maternal morbid obesity, antepartum (HCC)   Preterm labor symptoms and general obstetric precautions including but not limited to vaginal bleeding, contractions, leaking of fluid and fetal movement were reviewed in detail with the patient. Please refer to After Visit Summary for other counseling recommendations.  No Follow-up on file.   Catalina AntiguaPeggy Cire Deyarmin, MD

## 2016-11-18 LAB — CULTURE, BETA STREP (GROUP B ONLY)

## 2016-11-19 ENCOUNTER — Ambulatory Visit (HOSPITAL_COMMUNITY)
Admission: RE | Admit: 2016-11-19 | Discharge: 2016-11-19 | Disposition: A | Discharge status: Home / Self Care | Payer: Medicaid Other | Source: Ambulatory Visit | Attending: Obstetrics and Gynecology | Admitting: Obstetrics and Gynecology

## 2016-11-19 ENCOUNTER — Encounter (HOSPITAL_COMMUNITY)
Admission: RE | Admit: 2016-11-19 | Discharge: 2016-11-19 | Disposition: A | Discharge status: Home / Self Care | Payer: Medicaid Other | Source: Ambulatory Visit | Attending: Family Medicine | Admitting: Family Medicine

## 2016-11-19 ENCOUNTER — Encounter (HOSPITAL_COMMUNITY): Payer: Self-pay

## 2016-11-19 ENCOUNTER — Other Ambulatory Visit: Payer: Self-pay | Admitting: Obstetrics and Gynecology

## 2016-11-19 ENCOUNTER — Telehealth: Payer: Self-pay | Admitting: *Deleted

## 2016-11-19 DIAGNOSIS — Z3493 Encounter for supervision of normal pregnancy, unspecified, third trimester: Secondary | ICD-10-CM

## 2016-11-19 DIAGNOSIS — Z3A37 37 weeks gestation of pregnancy: Secondary | ICD-10-CM

## 2016-11-19 DIAGNOSIS — O34219 Maternal care for unspecified type scar from previous cesarean delivery: Secondary | ICD-10-CM

## 2016-11-19 DIAGNOSIS — O4400 Placenta previa specified as without hemorrhage, unspecified trimester: Secondary | ICD-10-CM

## 2016-11-19 DIAGNOSIS — O99213 Obesity complicating pregnancy, third trimester: Secondary | ICD-10-CM

## 2016-11-19 LAB — CBC
HEMATOCRIT: 33.8 % — AB (ref 36.0–46.0)
HEMOGLOBIN: 11.1 g/dL — AB (ref 12.0–15.0)
MCH: 24.9 pg — AB (ref 26.0–34.0)
MCHC: 32.8 g/dL (ref 30.0–36.0)
MCV: 75.8 fL — ABNORMAL LOW (ref 78.0–100.0)
Platelets: 234 10*3/uL (ref 150–400)
RBC: 4.46 MIL/uL (ref 3.87–5.11)
RDW: 17 % — ABNORMAL HIGH (ref 11.5–15.5)
WBC: 8.5 10*3/uL (ref 4.0–10.5)

## 2016-11-19 LAB — ABO/RH: ABO/RH(D): O POS

## 2016-11-19 LAB — GC/CHLAMYDIA PROBE AMP (~~LOC~~) NOT AT ARMC
Chlamydia: NEGATIVE
Neisseria Gonorrhea: NEGATIVE

## 2016-11-19 IMAGING — US US MFM OB TRANSVAGINAL
1 series · 15 of 28 positions shown · non-contrast
Comparison: none

[Series 1: us mfm ob transvaginal · 50 acquisitions, 15 frames shown]
[im 1/50]
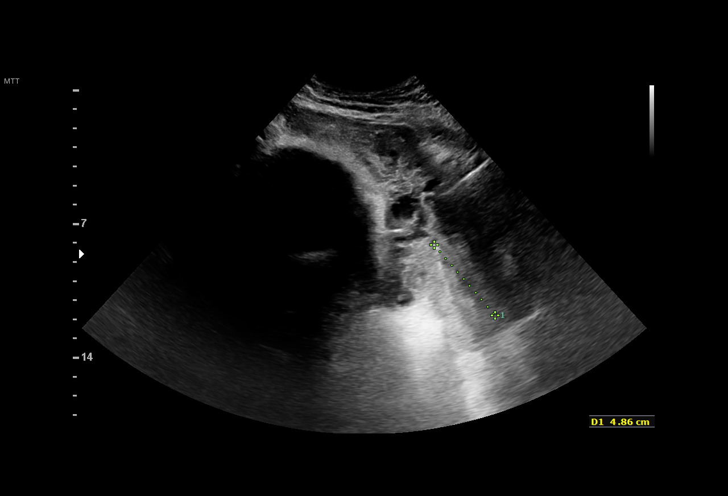
[im 4/50]
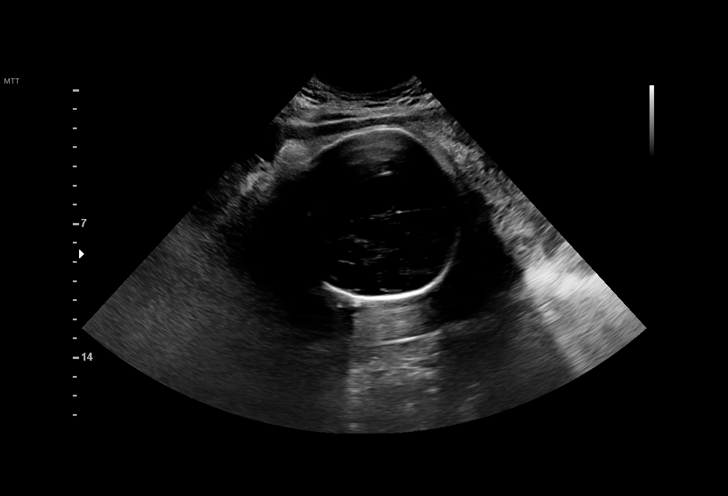
[im 8/50]
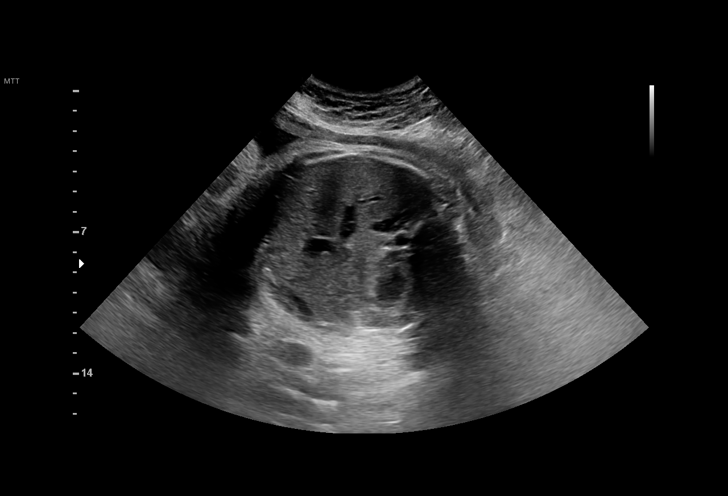
[im 11/50]
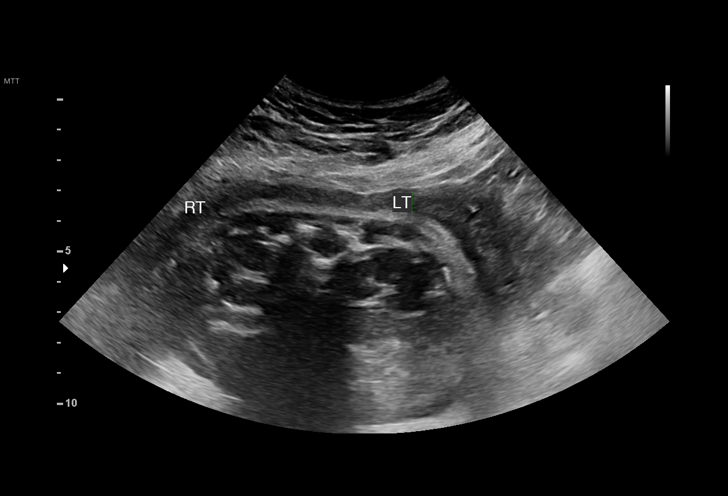
[im 15/50]
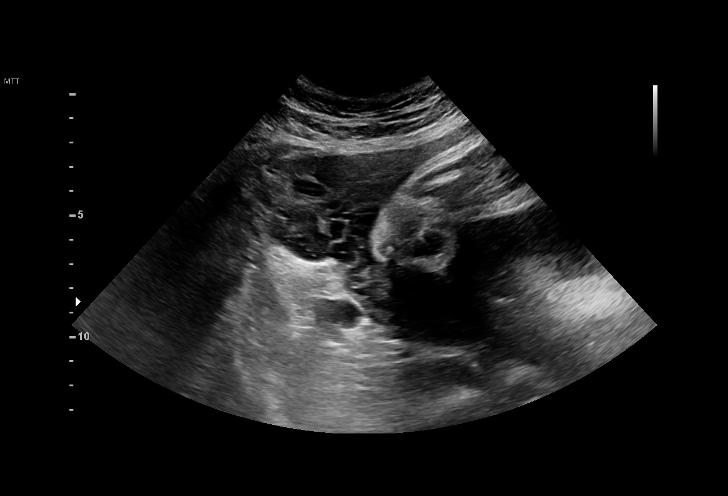
[im 19/50]
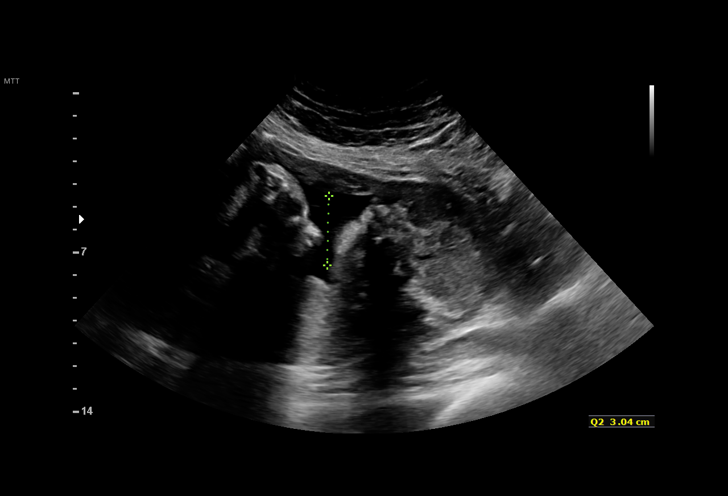
[im 22/50]
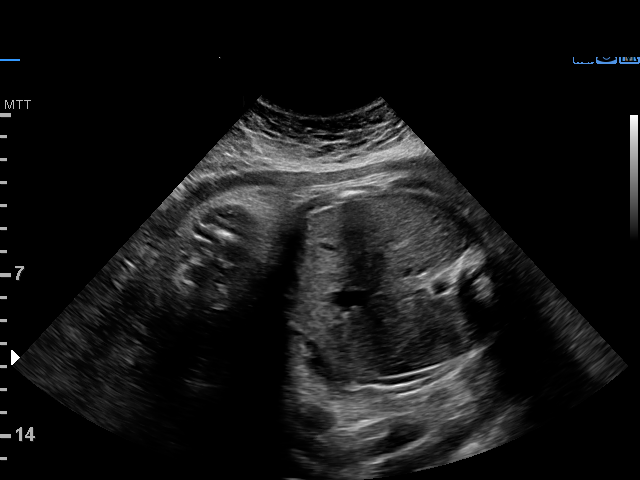
[im 26/50]
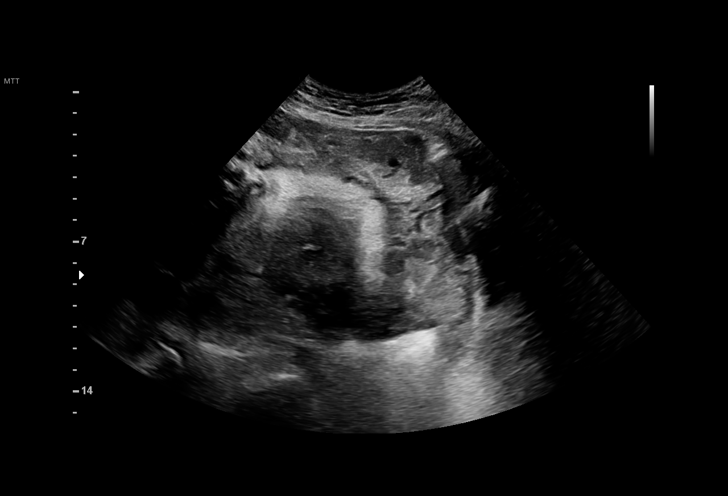
[im 28/50]
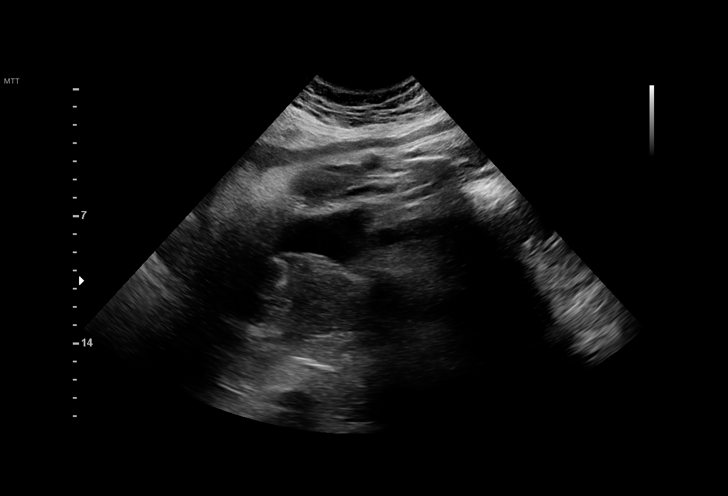
[im 31/50]
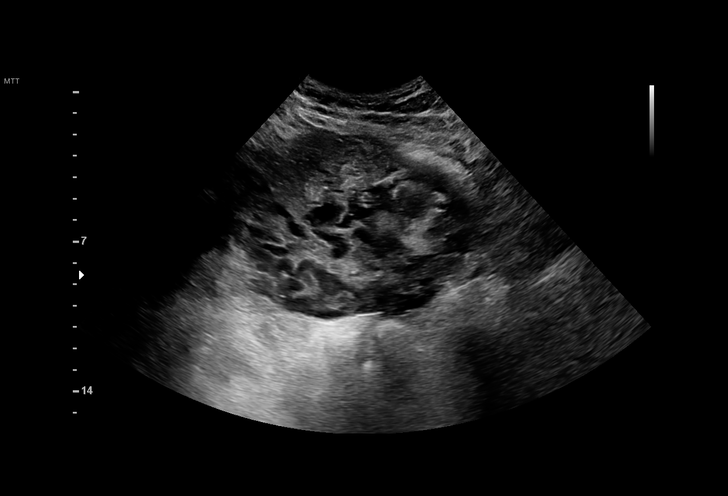
[im 35/50]
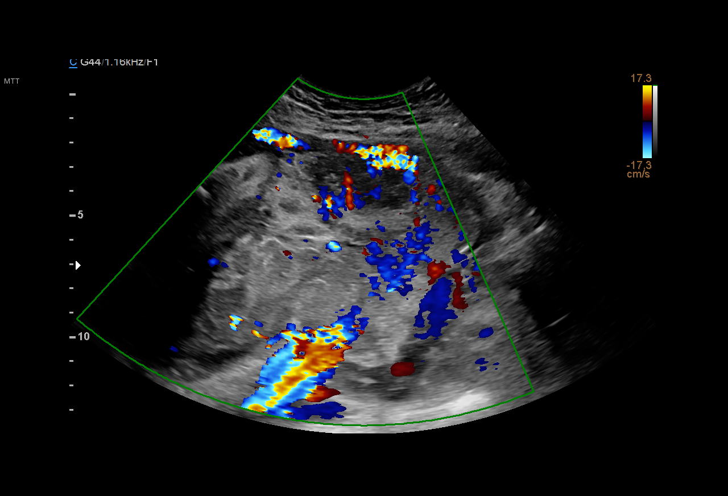
[im 39/50]
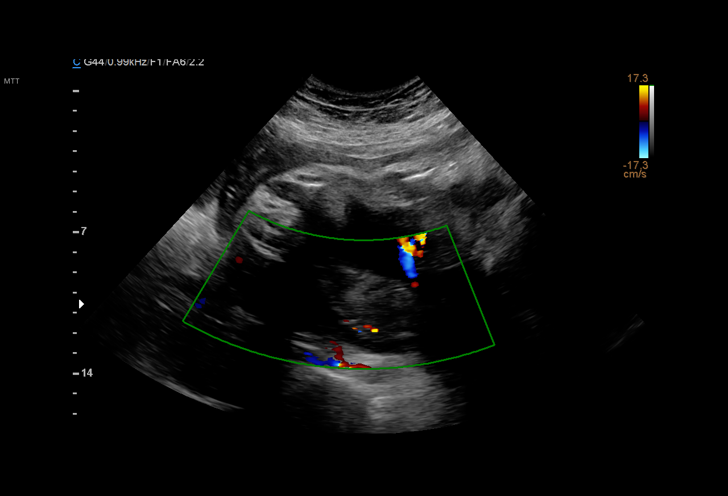
[im 42/50]
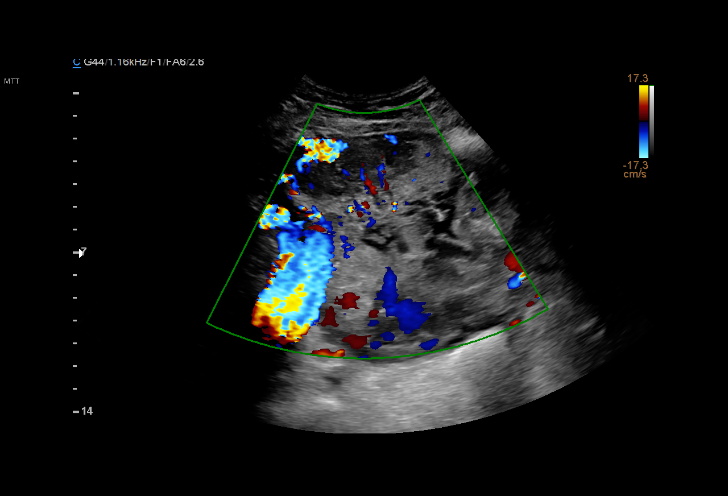
[im 46/50]
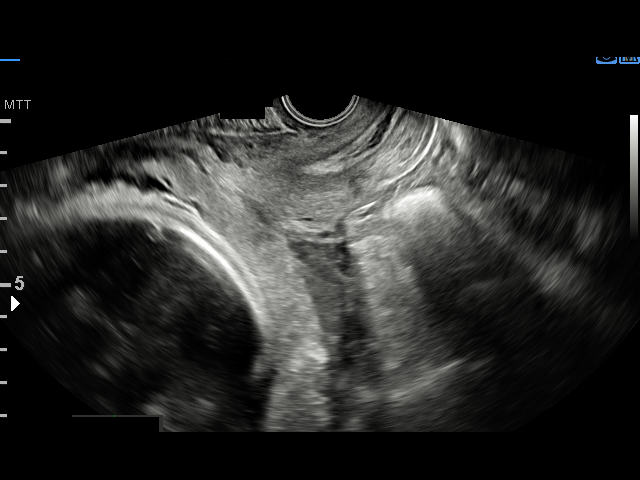
[im 50/50]
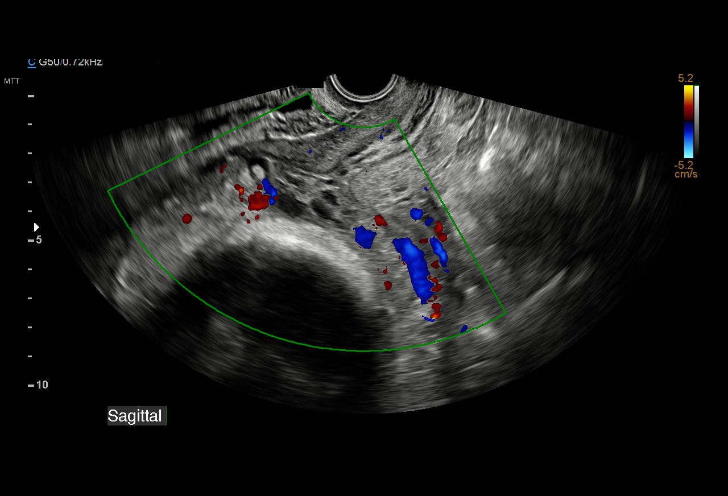

[15 of 28 positions shown; findings below may reference images not displayed]

1  KLEVER JUMPER          [id number]      [id number]     [id number]
Indications

37 weeks gestation of pregnancy
Late to prenatal care, third trimester
Previous cesarean delivery, antepartum x 3
Placenta previa specified as without
hemorrhage, third trimester
OB History

Gravidity:    5         Term:   3        Prem:   0        SAB:   1
TOP:          0       Ectopic:  0        Living: 3
Fetal Evaluation

Num Of Fetuses:     1
Fetal Heart         141
Rate(bpm):
Cardiac Activity:   Observed
Presentation:       Cephalic
Placenta:           Posterior Previa
P. Cord Insertion:  Not well visualized

Amniotic Fluid
AFI FV:      Subjectively within normal limits

AFI Sum(cm)     %Tile       Largest Pocket(cm)
11.38           34

RUQ(cm)       RLQ(cm)       LUQ(cm)        LLQ(cm)
4.47

Comment:    Small subchorionic hemorrhage noted.
Gestational Age

LMP:           37w 0d       Date:   03/05/16                 EDD:   12/10/16
Best:          37w 0d    Det. By:   LMP  (03/05/16)          EDD:   12/10/16
Cervix Uterus Adnexa

Cervix
Visualized transvaginally.

Uterus
No abnormality visualized.

Left Ovary
No adnexal mass visualized.

Right Ovary
No adnexal mass visualized.

Cul De Sac:   No free fluid seen.

Adnexa:       No abnormality visualized.
Impression

SIUP at 20w0d (remote read only)
active singleton fetus
AFI is normal
posterior previa
Recommendations

Recommend delivery by cesarean section (note our practice
typically recommends delivery 36-37 weeks for placenta
previa).

## 2016-11-19 NOTE — Telephone Encounter (Signed)
I called Renee Dennis again and she was still at the hospital and now will go to MFM for US.

## 2016-11-19 NOTE — Patient Instructions (Signed)
20 Luellen PuckerJamiel Vader  11/19/2016   Your procedure is scheduled on:  11/20/2016  [facility]30 [facility].  Pick up the phone at the desk and dial [phone].   Call this number if you have problems the morning of surgery: [phone]   Remember:   Do not eat food:After Midnight.  Do not drink clear liquids: After Midnight.  Take these medicines the morning of surgery with A SIP OF WATER: none   Do not wear jewelry, make-up or nail polish.  Do not wear lotions, powders, or perfumes. Do not wear deodorant.  Do not shave 48 hours prior to surgery.  Do not bring valuables to the hospital.  [facility] is not   responsible for any belongings or valuables brought to the hospital.  Contacts, dentures or bridgework may not be worn into surgery.  Leave suitcase in the car. After surgery it may be brought to your room.  For patients admitted to the hospital, checkout time is 11:00 AM the day of              discharge.   Patients discharged the day of surgery will not be allowed to drive             home.  Name and phone number of your driver: na  Special Instructions:   N/A   Please read over the following fact sheets that you were given:   Surgical Site Infection Prevention

## 2016-11-19 NOTE — Telephone Encounter (Signed)
Needs transvaginal ultrasound today per Dr. Pickens to cVergie Livingheck for previa- has c/s scheduled tomorrow. Called MFM and scheduled for 1:30.   [place] and left a message she has an ultrasound today at 1:30. Also called Preadmitting because it appears she had preadmission testing this am- unable to reach a person, but left a message re: need to let patient know about US appt . At 1:30.

## 2016-11-20 ENCOUNTER — Inpatient Hospital Stay (HOSPITAL_COMMUNITY)
Admission: RE | Admit: 2016-11-20 | Discharge: 2016-11-22 | DRG: 765 | Disposition: A | Discharge status: Home / Self Care | Payer: Medicaid Other | Source: Ambulatory Visit | Attending: Obstetrics and Gynecology | Admitting: Obstetrics and Gynecology

## 2016-11-20 ENCOUNTER — Encounter (HOSPITAL_COMMUNITY)
Admission: RE | Disposition: A | Discharge status: Home / Self Care | Payer: Self-pay | Source: Ambulatory Visit | Attending: Obstetrics and Gynecology

## 2016-11-20 ENCOUNTER — Inpatient Hospital Stay (HOSPITAL_COMMUNITY): Payer: Medicaid Other | Admitting: Anesthesiology

## 2016-11-20 ENCOUNTER — Encounter (HOSPITAL_COMMUNITY): Payer: Self-pay

## 2016-11-20 ENCOUNTER — Encounter: Payer: Self-pay | Admitting: Obstetrics and Gynecology

## 2016-11-20 DIAGNOSIS — A530 Latent syphilis, unspecified as early or late: Secondary | ICD-10-CM | POA: Insufficient documentation

## 2016-11-20 DIAGNOSIS — Z833 Family history of diabetes mellitus: Secondary | ICD-10-CM | POA: Diagnosis not present

## 2016-11-20 DIAGNOSIS — O99214 Obesity complicating childbirth: Secondary | ICD-10-CM | POA: Diagnosis present

## 2016-11-20 DIAGNOSIS — Z8249 Family history of ischemic heart disease and other diseases of the circulatory system: Secondary | ICD-10-CM

## 2016-11-20 DIAGNOSIS — Z6841 Body Mass Index (BMI) 40.0 and over, adult: Secondary | ICD-10-CM

## 2016-11-20 DIAGNOSIS — O9962 Diseases of the digestive system complicating childbirth: Secondary | ICD-10-CM | POA: Diagnosis present

## 2016-11-20 DIAGNOSIS — O34211 Maternal care for low transverse scar from previous cesarean delivery: Principal | ICD-10-CM | POA: Diagnosis present

## 2016-11-20 DIAGNOSIS — Z823 Family history of stroke: Secondary | ICD-10-CM | POA: Diagnosis not present

## 2016-11-20 DIAGNOSIS — K219 Gastro-esophageal reflux disease without esophagitis: Secondary | ICD-10-CM | POA: Diagnosis present

## 2016-11-20 DIAGNOSIS — Z3A37 37 weeks gestation of pregnancy: Secondary | ICD-10-CM

## 2016-11-20 DIAGNOSIS — O4403 Placenta previa specified as without hemorrhage, third trimester: Secondary | ICD-10-CM | POA: Diagnosis present

## 2016-11-20 DIAGNOSIS — Z98891 History of uterine scar from previous surgery: Secondary | ICD-10-CM

## 2016-11-20 DIAGNOSIS — Z302 Encounter for sterilization: Secondary | ICD-10-CM | POA: Diagnosis not present

## 2016-11-20 LAB — RPR: RPR Ser Ql: REACTIVE — AB

## 2016-11-20 LAB — RPR, QUANT+TP ABS (REFLEX)
Rapid Plasma Reagin, Quant: 1:1 {titer} — ABNORMAL HIGH
T Pallidum Abs: NEGATIVE

## 2016-11-20 LAB — PREPARE RBC (CROSSMATCH)

## 2016-11-20 SURGERY — Surgical Case
Anesthesia: Spinal | Site: Abdomen | Wound class: Clean Contaminated

## 2016-11-20 MED ORDER — OXYCODONE HCL 5 MG PO TABS: 5.0000 mg | ORAL_TABLET | ORAL | Status: DC | PRN

## 2016-11-20 MED ORDER — NALBUPHINE HCL 10 MG/ML IJ SOLN
5.0000 mg | Freq: Once | INTRAMUSCULAR | Status: AC | PRN
  Administered 2016-11-20: 5 mg via SUBCUTANEOUS

## 2016-11-20 MED ORDER — MORPHINE SULFATE-NACL 0.5-0.9 MG/ML-% IV SOSY
PREFILLED_SYRINGE | INTRAVENOUS | Status: AC
  Filled 2016-11-20: qty 1

## 2016-11-20 MED ORDER — SODIUM BICARBONATE 8.4 % IV SOLN
INTRAVENOUS | Status: DC | PRN
  Administered 2016-11-20: 3 mL via EPIDURAL

## 2016-11-20 MED ORDER — ONDANSETRON HCL 4 MG/2ML IJ SOLN
INTRAMUSCULAR | Status: DC | PRN
  Administered 2016-11-20: 4 mg via INTRAVENOUS

## 2016-11-20 MED ORDER — FENTANYL CITRATE (PF) 100 MCG/2ML IJ SOLN
INTRAMUSCULAR | Status: DC | PRN
  Administered 2016-11-20: 10 ug via INTRATHECAL
  Administered 2016-11-20: 100 ug via INTRAVENOUS

## 2016-11-20 MED ORDER — OXYTOCIN 40 UNITS IN LACTATED RINGERS INFUSION - SIMPLE MED: 2.5000 [IU]/h | INTRAVENOUS | Status: AC

## 2016-11-20 MED ORDER — SODIUM CHLORIDE 0.9% FLUSH: 3.0000 mL | INTRAVENOUS | Status: DC | PRN

## 2016-11-20 MED ORDER — MORPHINE SULFATE (PF) 4 MG/ML IV SOLN: 1.0000 mg | INTRAVENOUS | Status: DC | PRN

## 2016-11-20 MED ORDER — DIPHENHYDRAMINE HCL 25 MG PO CAPS: 25.0000 mg | ORAL_CAPSULE | ORAL | Status: DC | PRN

## 2016-11-20 MED ORDER — WITCH HAZEL-GLYCERIN EX PADS: 1.0000 "application " | MEDICATED_PAD | CUTANEOUS | Status: DC | PRN

## 2016-11-20 MED ORDER — PRENATAL MULTIVITAMIN CH
1.0000 | ORAL_TABLET | Freq: Every day | ORAL | Status: DC
  Administered 2016-11-21: 1 via ORAL
  Filled 2016-11-20: qty 1

## 2016-11-20 MED ORDER — DIPHENHYDRAMINE HCL 25 MG PO CAPS: 25.0000 mg | ORAL_CAPSULE | Freq: Four times a day (QID) | ORAL | Status: DC | PRN

## 2016-11-20 MED ORDER — NALBUPHINE HCL 10 MG/ML IJ SOLN
5.0000 mg | INTRAMUSCULAR | Status: DC | PRN
  Administered 2016-11-21 (×2): 5 mg via INTRAVENOUS
  Filled 2016-11-20 (×2): qty 1

## 2016-11-20 MED ORDER — OXYCODONE HCL 5 MG PO TABS
10.0000 mg | ORAL_TABLET | ORAL | Status: DC | PRN
  Administered 2016-11-20 – 2016-11-22 (×9): 10 mg via ORAL
  Filled 2016-11-20 (×9): qty 2

## 2016-11-20 MED ORDER — SCOPOLAMINE 1 MG/3DAYS TD PT72
MEDICATED_PATCH | TRANSDERMAL | Status: AC
  Filled 2016-11-20: qty 1

## 2016-11-20 MED ORDER — SENNOSIDES-DOCUSATE SODIUM 8.6-50 MG PO TABS
2.0000 | ORAL_TABLET | ORAL | Status: DC
  Administered 2016-11-21 (×2): 2 via ORAL
  Filled 2016-11-20 (×3): qty 2

## 2016-11-20 MED ORDER — DIPHENHYDRAMINE HCL 50 MG/ML IJ SOLN: 12.5000 mg | INTRAMUSCULAR | Status: DC | PRN

## 2016-11-20 MED ORDER — BUPIVACAINE IN DEXTROSE 0.75-8.25 % IT SOLN
INTRATHECAL | Status: DC | PRN
  Administered 2016-11-20: 10.5 mg via INTRATHECAL

## 2016-11-20 MED ORDER — ONDANSETRON HCL 4 MG/2ML IJ SOLN: 4.0000 mg | Freq: Three times a day (TID) | INTRAMUSCULAR | Status: DC | PRN

## 2016-11-20 MED ORDER — SIMETHICONE 80 MG PO CHEW
80.0000 mg | CHEWABLE_TABLET | ORAL | Status: DC
  Administered 2016-11-21 (×2): 80 mg via ORAL
  Filled 2016-11-20 (×2): qty 1

## 2016-11-20 MED ORDER — MEPERIDINE HCL 25 MG/ML IJ SOLN: 6.2500 mg | INTRAMUSCULAR | Status: DC | PRN

## 2016-11-20 MED ORDER — SCOPOLAMINE 1 MG/3DAYS TD PT72: 1.0000 | MEDICATED_PATCH | Freq: Once | TRANSDERMAL | Status: DC

## 2016-11-20 MED ORDER — IBUPROFEN 600 MG PO TABS
600.0000 mg | ORAL_TABLET | Freq: Four times a day (QID) | ORAL | Status: DC
  Administered 2016-11-20 – 2016-11-22 (×6): 600 mg via ORAL
  Filled 2016-11-20 (×6): qty 1

## 2016-11-20 MED ORDER — BUPIVACAINE IN DEXTROSE 0.75-8.25 % IT SOLN
INTRATHECAL | Status: AC
  Filled 2016-11-20: qty 2

## 2016-11-20 MED ORDER — DEXTROSE 5 % IV SOLN
3.0000 g | INTRAVENOUS | Status: AC
  Administered 2016-11-20: 3 g via INTRAVENOUS
  Filled 2016-11-20: qty 3000

## 2016-11-20 MED ORDER — NALBUPHINE HCL 10 MG/ML IJ SOLN
INTRAMUSCULAR | Status: AC
  Filled 2016-11-20: qty 1

## 2016-11-20 MED ORDER — NALBUPHINE HCL 10 MG/ML IJ SOLN: 5.0000 mg | Freq: Once | INTRAMUSCULAR | Status: AC | PRN

## 2016-11-20 MED ORDER — MENTHOL 3 MG MT LOZG: 1.0000 | LOZENGE | OROMUCOSAL | Status: DC | PRN

## 2016-11-20 MED ORDER — ONDANSETRON HCL 4 MG/2ML IJ SOLN
INTRAMUSCULAR | Status: AC
  Filled 2016-11-20: qty 2

## 2016-11-20 MED ORDER — ZOLPIDEM TARTRATE 5 MG PO TABS: 5.0000 mg | ORAL_TABLET | Freq: Every evening | ORAL | Status: DC | PRN

## 2016-11-20 MED ORDER — TETANUS-DIPHTH-ACELL PERTUSSIS 5-2.5-18.5 LF-MCG/0.5 IM SUSP: 0.5000 mL | Freq: Once | INTRAMUSCULAR | Status: DC

## 2016-11-20 MED ORDER — PHENYLEPHRINE 8 MG IN D5W 100 ML (0.08MG/ML) PREMIX OPTIME
INJECTION | INTRAVENOUS | Status: AC
  Filled 2016-11-20: qty 100

## 2016-11-20 MED ORDER — LACTATED RINGERS IV SOLN
INTRAVENOUS | Status: DC
  Administered 2016-11-20 (×3): via INTRAVENOUS

## 2016-11-20 MED ORDER — DIBUCAINE 1 % RE OINT: 1.0000 "application " | TOPICAL_OINTMENT | RECTAL | Status: DC | PRN

## 2016-11-20 MED ORDER — FENTANYL CITRATE (PF) 100 MCG/2ML IJ SOLN
INTRAMUSCULAR | Status: AC
  Filled 2016-11-20: qty 2

## 2016-11-20 MED ORDER — ACETAMINOPHEN 325 MG PO TABS
650.0000 mg | ORAL_TABLET | ORAL | Status: DC | PRN
  Administered 2016-11-20 – 2016-11-22 (×6): 650 mg via ORAL
  Filled 2016-11-20 (×7): qty 2

## 2016-11-20 MED ORDER — LACTATED RINGERS IV SOLN
Freq: Once | INTRAVENOUS | Status: AC
  Administered 2016-11-20: 13:00:00 via INTRAVENOUS

## 2016-11-20 MED ORDER — OXYTOCIN 10 UNIT/ML IJ SOLN
INTRAVENOUS | Status: DC | PRN
  Administered 2016-11-20: 40 [IU] via INTRAVENOUS

## 2016-11-20 MED ORDER — ACETAMINOPHEN 325 MG PO TABS
975.0000 mg | ORAL_TABLET | Freq: Once | ORAL | Status: AC
  Administered 2016-11-20: 975 mg via ORAL

## 2016-11-20 MED ORDER — SIMETHICONE 80 MG PO CHEW: 80.0000 mg | CHEWABLE_TABLET | ORAL | Status: DC | PRN

## 2016-11-20 MED ORDER — PHENYLEPHRINE 8 MG IN D5W 100 ML (0.08MG/ML) PREMIX OPTIME
INJECTION | INTRAVENOUS | Status: DC | PRN
  Administered 2016-11-20: 60 ug/min via INTRAVENOUS

## 2016-11-20 MED ORDER — SIMETHICONE 80 MG PO CHEW
80.0000 mg | CHEWABLE_TABLET | Freq: Three times a day (TID) | ORAL | Status: DC
  Administered 2016-11-21 – 2016-11-22 (×4): 80 mg via ORAL
  Filled 2016-11-20 (×3): qty 1

## 2016-11-20 MED ORDER — NALBUPHINE HCL 10 MG/ML IJ SOLN: 5.0000 mg | INTRAMUSCULAR | Status: DC | PRN

## 2016-11-20 MED ORDER — NALOXONE HCL 2 MG/2ML IJ SOSY: 1.0000 ug/kg/h | PREFILLED_SYRINGE | INTRAVENOUS | Status: DC | PRN

## 2016-11-20 MED ORDER — ACETAMINOPHEN 325 MG PO TABS
ORAL_TABLET | ORAL | Status: AC
  Filled 2016-11-20: qty 3

## 2016-11-20 MED ORDER — SCOPOLAMINE 1 MG/3DAYS TD PT72
1.0000 | MEDICATED_PATCH | Freq: Once | TRANSDERMAL | Status: DC
  Administered 2016-11-20: 1.5 mg via TRANSDERMAL

## 2016-11-20 MED ORDER — OXYTOCIN 10 UNIT/ML IJ SOLN
INTRAMUSCULAR | Status: AC
  Filled 2016-11-20: qty 4

## 2016-11-20 MED ORDER — COCONUT OIL OIL: 1.0000 "application " | TOPICAL_OIL | Status: DC | PRN

## 2016-11-20 MED ORDER — PROMETHAZINE HCL 25 MG/ML IJ SOLN: 6.2500 mg | INTRAMUSCULAR | Status: DC | PRN

## 2016-11-20 MED ORDER — NALOXONE HCL 0.4 MG/ML IJ SOLN: 0.4000 mg | INTRAMUSCULAR | Status: DC | PRN

## 2016-11-20 MED ORDER — MIDAZOLAM HCL 2 MG/2ML IJ SOLN: 0.5000 mg | Freq: Once | INTRAMUSCULAR | Status: DC | PRN

## 2016-11-20 MED ORDER — LACTATED RINGERS IV SOLN: INTRAVENOUS | Status: DC

## 2016-11-20 MED ORDER — SODIUM CHLORIDE 0.9 % IR SOLN
Status: DC | PRN
  Administered 2016-11-20: 1000 mL

## 2016-11-20 MED ORDER — MORPHINE SULFATE-NACL 0.5-0.9 MG/ML-% IV SOSY
PREFILLED_SYRINGE | INTRAVENOUS | Status: DC | PRN
  Administered 2016-11-20: .2 mg via EPIDURAL

## 2016-11-20 SURGICAL SUPPLY — 41 items
BENZOIN TINCTURE PRP APPL 2/3 (GAUZE/BANDAGES/DRESSINGS) ×3 IMPLANT
CANISTER SUCT 3000ML PPV (MISCELLANEOUS) ×3 IMPLANT
CHLORAPREP W/TINT 26ML (MISCELLANEOUS) ×3 IMPLANT
CLOSURE STERI-STRIP 1/2X4 (GAUZE/BANDAGES/DRESSINGS) ×1
CLOSURE WOUND 1/2 X4 (GAUZE/BANDAGES/DRESSINGS) ×2
CLSR STERI-STRIP ANTIMIC 1/2X4 (GAUZE/BANDAGES/DRESSINGS) ×2 IMPLANT
DERMABOND ADVANCED (GAUZE/BANDAGES/DRESSINGS) ×2
DERMABOND ADVANCED .7 DNX12 (GAUZE/BANDAGES/DRESSINGS) ×1 IMPLANT
DRSG OPSITE POSTOP 4X10 (GAUZE/BANDAGES/DRESSINGS) ×3 IMPLANT
ELECT REM PT RETURN 9FT ADLT (ELECTROSURGICAL) ×3
ELECTRODE REM PT RTRN 9FT ADLT (ELECTROSURGICAL) ×1 IMPLANT
GLOVE BIOGEL PI IND STRL 7.0 (GLOVE) ×2 IMPLANT
GLOVE BIOGEL PI IND STRL 7.5 (GLOVE) ×1 IMPLANT
GLOVE BIOGEL PI INDICATOR 7.0 (GLOVE) ×4
GLOVE BIOGEL PI INDICATOR 7.5 (GLOVE) ×2
GLOVE SKINSENSE NS SZ7.0 (GLOVE) ×2
GLOVE SKINSENSE STRL SZ7.0 (GLOVE) ×1 IMPLANT
GOWN STRL REUS W/ TWL LRG LVL3 (GOWN DISPOSABLE) ×2 IMPLANT
GOWN STRL REUS W/ TWL XL LVL3 (GOWN DISPOSABLE) ×1 IMPLANT
GOWN STRL REUS W/TWL LRG LVL3 (GOWN DISPOSABLE) ×4
GOWN STRL REUS W/TWL XL LVL3 (GOWN DISPOSABLE) ×2
NS IRRIG 1000ML POUR BTL (IV SOLUTION) ×3 IMPLANT
PACK C SECTION WH (CUSTOM PROCEDURE TRAY) ×3 IMPLANT
PAD ABD 7.5X8 STRL (GAUZE/BANDAGES/DRESSINGS) ×6 IMPLANT
PAD ABD DERMACEA PRESS 5X9 (GAUZE/BANDAGES/DRESSINGS) ×3 IMPLANT
PAD OB MATERNITY 4.3X12.25 (PERSONAL CARE ITEMS) ×3 IMPLANT
PAD PREP 24X48 CUFFED NSTRL (MISCELLANEOUS) ×3 IMPLANT
SPONGE LAP 18X18 X RAY DECT (DISPOSABLE) ×6 IMPLANT
STRIP CLOSURE SKIN 1/2X4 (GAUZE/BANDAGES/DRESSINGS) ×4 IMPLANT
SUT CHROMIC 1 CTX 36 (SUTURE) ×3 IMPLANT
SUT MON AB 4-0 PS1 27 (SUTURE) ×3 IMPLANT
SUT MON AB-0 CT1 36 (SUTURE) ×9 IMPLANT
SUT PLAIN 2 0 (SUTURE) ×2
SUT PLAIN ABS 2-0 CT1 27XMFL (SUTURE) ×1 IMPLANT
SUT VIC AB 0 CT1 36 (SUTURE) ×6 IMPLANT
SUT VIC AB 2-0 CT1 27 (SUTURE) ×2
SUT VIC AB 2-0 CT1 TAPERPNT 27 (SUTURE) ×1 IMPLANT
SUT VIC AB 2-0 SH 27 (SUTURE) ×8
SUT VIC AB 2-0 SH 27XBRD (SUTURE) ×4 IMPLANT
SUT VIC AB 3-0 CT1 27 (SUTURE) ×2
SUT VIC AB 3-0 CT1 TAPERPNT 27 (SUTURE) ×1 IMPLANT

## 2016-11-20 NOTE — Anesthesia Preprocedure Evaluation (Addendum)
Anesthesia Evaluation  Patient identified by MRN, date of birth, ID band Patient awake    Reviewed: Allergy & Precautions, NPO status , Patient's Chart, lab work & pertinent test results  History of Anesthesia Complications Negative for: history of anesthetic complications  Airway Mallampati: II  TM Distance: >3 FB Neck ROM: Full    Dental  (+) Dental Advisory Given   Pulmonary neg pulmonary ROS,    breath sounds clear to auscultation       Cardiovascular negative cardio ROS   Rhythm:Regular Rate:Normal     Neuro/Psych negative neurological ROS     GI/Hepatic Neg liver ROS, GERD  Poorly Controlled,  Endo/Other  Morbid obesity  Renal/GU negative Renal ROS     Musculoskeletal   Abdominal (+) + obese,   Peds  Hematology plt 234k   Anesthesia Other Findings   Reproductive/Obstetrics (+) Pregnancy                            Anesthesia Physical Anesthesia Plan  ASA: III  Anesthesia Plan: Combined Spinal and Epidural and Spinal   Post-op Pain Management:    Induction:   Airway Management Planned: Natural Airway and Simple Face Mask  Additional Equipment:   Intra-op Plan:   Post-operative Plan:   Informed Consent: I have reviewed the patients History and Physical, chart, labs and discussed the procedure including the risks, benefits and alternatives for the proposed anesthesia with the patient or authorized representative who has indicated his/her understanding and acceptance.   Dental advisory given  Plan Discussed with: CRNA and Surgeon  Anesthesia Plan Comments: (Plan routine monitors, SAB Addendum: Dr. Vergie LivingPickens requests Combined Spinal/epidural)       Anesthesia Quick Evaluation

## 2016-11-20 NOTE — Anesthesia Procedure Notes (Addendum)
Combined Spinal/Epidural Patient location during procedure: OR Start time: 11/20/2016 2:06 PM End time: 11/20/2016 2:15 PM  Staffing Anesthesiologist: Jairo BenJACKSON, Kyros Salzwedel Performed: anesthesiologist   Preanesthetic Checklist Completed: patient identified, surgical consent, pre-op evaluation, timeout performed, IV checked, risks and benefits discussed and monitors and equipment checked  Epidural Patient position: sitting Prep: site prepped and draped and DuraPrep Patient monitoring: heart rate, cardiac monitor, continuous pulse ox and blood pressure Approach: midline Location: L3-L4 Injection technique: LOR air  Needle:  Needle type: Tuohy  Needle gauge: 17 G Needle length: 9 cm Needle insertion depth: 7 cm Catheter type: closed end flexible Catheter size: 19 Gauge Catheter at skin depth: 12 cm  Assessment Events: blood not aspirated, injection not painful, no injection resistance, negative IV test and no paresthesia  Additional Notes CSE: Pt identified in Operating room.  Monitors applied. Working IV access confirmed. Sterile prep, drape lumbar spine.  1% lido local L 3,4.  #17ga Touhy LOR air at 7 cm L 3,4.  Spinal needle inserted to clear CSF and 10.5 mg 0.75% Bupivacaine with dextrose, fentanyl, morphine injected, asp CSF beginning and end of injection. Needle withdrawn and epidural cath in easily to 12 cm skin.  Patient asymptomatic, VSS, no heme aspirated, tolerated well.  Renee Dennis, MDReason for block:surgical anesthesia

## 2016-11-20 NOTE — Op Note (Signed)
Cesarean Section Operative Report  Renee Dennis  11/20/2016  Indications: history c/s x3; placenta previa  Pre-operative Diagnosis: Repeat low transverse cesarean section with bilateral tubal ligation ([place])  Post-operative Diagnosis: Same   Surgeon: Surgeon(s) and Role:    * Brodnax Bingharlie Porchea Charrier, MD - Primary    * Kathrynn RunningNoah Bedford Wouk, MD - Assisting   Assistants: none  Anesthesia: CSE    Estimated Blood Loss: 700 ml  Total IV Fluids: 2600 ml LR  Urine Output:: 200 ml clear yellow urine  Antibiotics: Ancef 2gm given within one hour of skin incision  VTE prevention: SCDs to bilateral lower extremities  Specimens: placenta to pathology  Findings: Significant adhesions on the anterior lower uterine segment.  Viable female infant in cephalic presentation; Apgars 9/9; weight 3130g; arterial cord pH not obtained; clear amniotic fluid; intact placenta with three vessel cord; normal uterus, fallopian tubes and ovaries bilaterally.  Baby condition / location:  Couplet care / Skin to Skin   Indications: Renee Dennis is a 35 y.o. [id number] with an IUP 6149w1d presenting repeat c/s for indications above.  The risks, benefits, complications, treatment options, and exected outcomes were discussed with the patient . The patient dwith the proposed plan, giving informed consent. identified as Renee Dennis and the procedure verified as C-Section Delivery.  Procedure Details:  The patient was taken back to the operative suite where combined spinal/epidural anesthesia was placed.  A time out was held and the above information confirmed.   After induction of anesthesia, the patient was draped and prepped in the usual sterile manner and placed in a dorsal supine position with a leftward tilt. A Pfannenstiel incision was made and carried down through the subcutaneous tissue to the fascia. Fascial incision was made and sharply extended transversely. The fascia was separated from the underlying  rectus tissue superiorly and inferiorly. The peritoneum was identified and bluntly entered and extended longitudinally. Goulet retractor was placed to retract bladder. Dense adhesions to lower uterine segment - hysterotomy incision made just above these adhesions but still in the lower uterine segment. Incision was extended bluntly. Delivered from cephalic presentation was a viable infant with Apgars and weight as above.  After waiting 60 seconds for delayed cord cutting, the umbilical cord was clamped and cut cord blood was obtained for evaluation. Cord ph was not sent. The placenta was removed Intact and appeared normal. The uterine outline, tubes and ovaries appeared normal. The uterine incision was closed with running locked sutures of 0 Monocryl with an imbricating layer of the same. BTL performed using [place] procedure: on the left side the  Fallopian tube was traced out to the fimbriae and then was grasped with babcock at the mid isthmus point.  Electric cautery used to make a small hole in mesosalpinx. Two 2-0 vicryl sutures were passed through the hole and used to ligate the tube and then ligated again below the prior sutures. The intervening section of the tube was then excised, and the procedure repeated on the right. Hemostasis was observed. 0 chromic and 0 vicryl was used to control bleeding from various sites at the hysterotomy incision. Hemostasis was observed.  The peritoneum was closed with 3-0 vicryl. The rectus muscles were examined and hemostasis observed. The fascia was then reapproximated with running sutures of 0Vicryl.  The subcuticular closure was performed using 2-0plain gut. The skin was closed with 4-0Vicryl.   Instrument, sponge, and needle counts were correct prior the abdominal closure and were correct at the conclusion of the case.  Disposition: PACU - hemodynamically stable.   Maternal Condition: stable    Signed: Lavonne Chickoah B WoukMD 11/20/2016 3:54 PM   Agree with above.  I was present and scrubbed for the entire procedure.   Cornelia Copaharlie Seona Clemenson, Jr MD Attending Center for [facility](Faculty Practice)

## 2016-11-20 NOTE — Plan of Care (Signed)
Problem: Education: Goal: Knowledge of [facility] Education information/materials will improve Admission paperwork, unit safety and routines and baby and me booklet reviewed with patient and significant other. Patient verbalizes understanding of information.   Problem: Nutritional: Goal: Mothers verbalization of comfort with breastfeeding process will improve Outcome: Completed/Met Date Met: 11/20/16 Patient states she plans to breast and bottle feed at home. Discussed importance of exclusively breast feeding in the hospital and until her milk comes in. Patient verbalized understanding and stated that she may still decide to do some bottles in hospital.

## 2016-11-20 NOTE — Consult Note (Signed)
[facility]  Delivery Note:  C-section       11/20/2016  2:39 PM  I was called to the operating room at the request of the patient's obstetrician (Dr. Pickens) for a repeat c-section.  PRENATAL HX:  This is a 35 y.o. G5P3013 at 37 and 1/[redacted] weeks gestation who present for repeat c-section due to placenta previa.  Her pregnancy is also complicated by poor dating (34 week scan) so she received BMZ x2 earlier this month.     INTRAPARTUM HX:   Repeat c-section with AROM at delivery  DELIVERY:  Clear fluid.  Cord clamping delayed 60 seconds.  Infant was vigorous at delivery, requiring no resuscitation other than standard warming, drying and stimulation.  APGARs 9 and 9.  Exam within normal limits.  After 5 minutes, baby left with nurse to assist parents with skin-to-skin care.   _____________________ Electronically Signed By: Ameet Sandy, MD Neonatologist  

## 2016-11-20 NOTE — Transfer of Care (Signed)
Immediate Anesthesia Transfer of Care Note  Patient: Renee Dennis  Procedure(s) Performed: Procedure(s): CESAREAN SECTION (N/A)  Patient Location: PACU  Anesthesia Type:Spinal  Level of Consciousness: awake, alert  and oriented  Airway & Oxygen Therapy: Patient Spontanous Breathing  Post-op Assessment: Report given to RN and Post -op Vital signs reviewed and stable  Post vital signs: Reviewed and stable  Last Vitals:  Vitals:   11/20/16 1250  BP: 133/84  Pulse: 80  Resp: 16  Temp: 36.9 C    Last Pain:  Vitals:   11/20/16 1250  TempSrc: Oral      Patients Stated Pain Goal: 5 (11/20/16 1250)  Complications: No apparent anesthesia complications

## 2016-11-20 NOTE — Progress Notes (Signed)
May administer oxycodone before 12 hr duramorph time is up per anesthesia MD.

## 2016-11-20 NOTE — H&P (Signed)
LABOR AND DELIVERY ADMISSION HISTORY AND PHYSICAL NOTE  Luellen PuckerJamiel Senk is a 35 y.o. female [phone] with IUP at 5893w1d by 34 wk u/s presenting for rltcs.   She reports positive fetal movement. She denies leakage of fluid or vaginal bleeding.  Prenatal History/Complications:  Past Medical History: History reviewed. No pertinent past medical history.  Past Surgical History: Past Surgical History:  Procedure Laterality Date  . CESAREAN SECTION      Obstetrical History: OB History    Gravida Para Term Preterm AB Living   5 3 3  0 1 3   SAB TAB Ectopic Multiple Live Births   1 0 0 0 3      Social History: Social History   Social History  . Marital status: Single    Spouse name: N/A  . Number of children: N/A  . Years of education: N/A   Social History Main Topics  . Smoking status: Never Smoker  . Smokeless tobacco: Never Used  . Alcohol use No  . Drug use: No  . Sexual activity: Not Currently   Other Topics Concern  . None   Social History Narrative  . None    Family History: Family History  Problem Relation Age of Onset  . Diabetes Maternal Grandmother   . Stroke Maternal Grandfather   . Heart disease Maternal Grandfather     Allergies: Allergies  Allergen Reactions  . Asa [Aspirin] Hives and Swelling  . Mushroom Extract Complex Hives and Swelling    Prescriptions Prior to Admission  Medication Sig Dispense Refill Last Dose  . Prenatal Vit-Fe Fumarate-FA (PRENATAL MULTIVITAMIN) TABS tablet Take 1 tablet by mouth daily at 12 noon.   11/19/2016 at Unknown time     Review of Systems   All systems reviewed and negative except as stated in HPI  Blood pressure 133/84, pulse 80, temperature 98.5 F (36.9 C), temperature source Oral, resp. rate 16, last menstrual period 03/05/2016, SpO2 99 %. General appearance: alert, cooperative and appears stated age Lungs: clear to auscultation bilaterally Heart: regular rate and rhythm Abdomen: soft, non-tender;  bowel sounds normal Extremities: No calf swelling or tenderness   Prenatal labs: ABO, Rh: --/--/O POS (12/18 1310) Antibody: NEG (11/20 0001) Rubella: !Error!imm RPR: NON REAC (11/20 0001)  HBsAg: NEGATIVE (11/20 0001)  HIV: NONREACTIVE (11/20 0001)  GBS:   neg 1 hr Glucola: 85 Genetic screening:  Too late Anatomy US: wnl save for previa  Prenatal Transfer Tool  Maternal Diabetes: No Genetic Screening: Declined Maternal Ultrasounds/Referrals: Abnormal:  Findings:   Other:placenta previa Fetal Ultrasounds or other Referrals:  None Maternal Substance Abuse:  No Significant Maternal Medications:  Recent betamethasone Significant Maternal Lab Results: Lab values include: Group B Strep negative  Results for orders placed or performed during the hospital encounter of 11/20/16 (from the past 24 hour(s))  Prepare RBC (crossmatch)   Collection Time: 11/20/16  2:00 PM  Result Value Ref Range   Order Confirmation ORDER PROCESSED BY BLOOD BANK   Results for orders placed or performed during the hospital encounter of 11/19/16 (from the past 24 hour(s))  ABO/Rh   Collection Time: 11/19/16  1:10 PM  Result Value Ref Range   ABO/RH(D) O POS   Results for orders placed or performed during the hospital encounter of 11/19/16 (from the past 24 hour(s))  CBC   Collection Time: 11/19/16  1:10 PM  Result Value Ref Range   WBC 8.5 4.0 - 10.5 K/uL   RBC 4.46 3.87 - 5.11 MIL/uL  Hemoglobin 11.1 (L) 12.0 - 15.0 g/dL   HCT 81.133.8 (L) 91.436.0 - 78.246.0 %   MCV 75.8 (L) 78.0 - 100.0 fL   MCH 24.9 (L) 26.0 - 34.0 pg   MCHC 32.8 30.0 - 36.0 g/dL   RDW 95.617.0 (H) 21.311.5 - 08.615.5 %   Platelets 234 150 - 400 K/uL  Type and screen   Collection Time: 11/19/16  1:10 PM  Result Value Ref Range   Blood Product Unit Number [id number]    Unit Type and Rh 5100    Blood Product Expiration Date [id number]    Blood Product Unit Number [id number]    Unit Type and Rh 5100    Blood Product Expiration Date  [id number]     Patient Active Problem List   Diagnosis Date Noted  . Placenta previa antepartum 11/06/2016  . Encounter for supervision of low-risk pregnancy in third trimester 10/22/2016  . Previous cesarean section complicating pregnancy 10/22/2016  . Maternal morbid obesity, antepartum (HCC) 10/22/2016    Assessment: Luellen PuckerJamiel Mcclatchey is a 35 y.o. [id number] at 3765w1d here for rltcs  #Hx c/s x3, placenta previa current pregnancy: scheduled c/s today. Performing today given previa. No bleeding. Poor dating (34 wk scan) so received was given bmz x2 earlier this month.  The risks of cesarean section were discussed with the patient including but were not limited to: bleeding which may require transfusion or reoperation; infection which may require antibiotics; injury to bowel, bladder, ureters or other surrounding organs; injury to the fetus; need for additional procedures including hysterectomy in the event of a life-threatening hemorrhage; placental abnormalities wth subsequent pregnancies, incisional problems, thromboembolic phenomenon and other postoperative/anesthesia complications.  Patient also desires permanent sterilization.  Other reversible forms of contraception were discussed with patient; she declines all other modalities. Risks of procedure discussed with patient including but not limited to: risk of regret, permanence of method, bleeding, infection, injury to surrounding organs and need for additional procedures.  Failure risk of 1-2% with increased risk of ectopic gestation if pregnancy occurs was also discussed with patient.  The patient concurred with the proposed plan, giving informed written consent for the procedures.  Anesthesia and OR aware.  Preoperative prophylactic antibiotics and SCDs ordered on call to the OR.  To OR when ready.  #ID:  gbs neg #MOF: br #MOC: btl  Matteson Blue B Doil Kamara 11/20/2016, 1:03 PM

## 2016-11-20 NOTE — Lactation Note (Signed)
This note was copied from a baby's chart. Lactation Consultation Note  Patient Name: Boy Luellen PuckerJamiel Garrison QMVHQ'IToday's Date: 11/20/2016 Reason for consult: Initial assessment Baby at 4 hr of life. Mom bf and formula fed all of her children and plans to do the same with this baby. She was agreeable to latching at this visit. Mom has large compressible breast with erect nipples. She latches baby in a modified cradle position. She denies breast or nipple pain, voiced no concerns. Discussed baby behavior, feeding frequency, baby belly size, voids, wt loss, breast changes, and nipple care. Demonstrated manual expression, colostrum noted bilaterally, spoon in room. Given lactation handouts. Aware of OP services and support group.      Maternal Data Has patient been taught Hand Expression?: Yes Does the patient have breastfeeding experience prior to this delivery?: Yes  Feeding Feeding Type: Breast Fed Length of feed: 4 min  LATCH Score/Interventions Latch: Grasps breast easily, tongue down, lips flanged, rhythmical sucking.  Audible Swallowing: A few with stimulation Intervention(s): Alternate breast massage;Hand expression;Skin to skin  Type of Nipple: Everted at rest and after stimulation  Comfort (Breast/Nipple): Soft / non-tender     Hold (Positioning): Assistance needed to correctly position infant at breast and maintain latch. Intervention(s): Position options;Support Pillows  LATCH Score: 8  Lactation Tools Discussed/Used WIC Program: Yes   Consult Status Consult Status: Follow-up Date: 11/21/16 Follow-up type: In-patient    Rulon Eisenmengerlizabeth E Sebasthian Stailey 11/20/2016, 6:50 PM

## 2016-11-20 NOTE — Anesthesia Postprocedure Evaluation (Signed)
Anesthesia Post Note  Patient: Designer, [name]  Procedure(s) Performed: Procedure(s) (LRB): CESAREAN SECTION (N/A)  Patient location during evaluation: PACU Anesthesia Type: Combined Spinal/Epidural Level of consciousness: awake and alert, oriented and patient cooperative Pain management: pain level controlled Vital Signs Assessment: post-procedure vital signs reviewed and stable Respiratory status: spontaneous breathing, nonlabored ventilation and respiratory function stable Cardiovascular status: blood pressure returned to baseline and stable Postop Assessment: epidural receding, spinal receding and no signs of nausea or vomiting Anesthetic complications: no        Last Vitals:  Vitals:   11/20/16 1630 11/20/16 1645  BP: (!) 106/57 110/71  Pulse: 96 68  Resp: 20 13  Temp:      Last Pain:  Vitals:   11/20/16 1645  TempSrc:   PainSc: 0-No pain   Pain Goal: Patients Stated Pain Goal: 0 (11/20/16 1615)               [name],[name]

## 2016-11-20 NOTE — Progress Notes (Signed)
Notified OB resident of reactive RPR.

## 2016-11-21 DIAGNOSIS — O34211 Maternal care for low transverse scar from previous cesarean delivery: Secondary | ICD-10-CM

## 2016-11-21 DIAGNOSIS — O4403 Placenta previa specified as without hemorrhage, third trimester: Secondary | ICD-10-CM

## 2016-11-21 DIAGNOSIS — Z3A37 37 weeks gestation of pregnancy: Secondary | ICD-10-CM

## 2016-11-21 LAB — CBC
HCT: 29.3 % — ABNORMAL LOW (ref 36.0–46.0)
Hemoglobin: 9.5 g/dL — ABNORMAL LOW (ref 12.0–15.0)
MCH: 24.7 pg — AB (ref 26.0–34.0)
MCHC: 32.4 g/dL (ref 30.0–36.0)
MCV: 76.1 fL — AB (ref 78.0–100.0)
PLATELETS: 207 10*3/uL (ref 150–400)
RBC: 3.85 MIL/uL — ABNORMAL LOW (ref 3.87–5.11)
RDW: 17.3 % — ABNORMAL HIGH (ref 11.5–15.5)
WBC: 8.2 10*3/uL (ref 4.0–10.5)

## 2016-11-21 NOTE — Progress Notes (Signed)
Post Partum Day 1 Subjective: Patient states moderate pain at incision site, well-controlled with Ibuprofen and Percocet Minimal ambulation so far. No flatus or BM yet.   Objective: Blood pressure 130/62, pulse 65, temperature 98.6 F (37 C), temperature source Oral, resp. rate 18, last menstrual period 03/05/2016, SpO2 100 %, unknown if currently breastfeeding.  Physical Exam:  General: alert and cooperative Lochia: appropriate Uterine Fundus: firm Incision: healing well DVT Evaluation: No evidence of DVT seen on physical exam.   Recent Labs  11/19/16 1310 11/21/16 0537  HGB 11.1* 9.5*  HCT 33.8* 29.3*    Assessment/Plan: Plan for discharge tomorrow, Breastfeeding and Contraception BT:   LOS: 1 day   Marny LowensteinJulie N Jonan Seufert, PA-C  11/21/2016, 7:33 AM

## 2016-11-21 NOTE — Plan of Care (Signed)
Problem: Activity: Goal: Will verbalize the importance of balancing activity with adequate rest periods Encouraged ambulation in hallway three times today to assist with incisional and gas discomfort.

## 2016-11-21 NOTE — Anesthesia Postprocedure Evaluation (Signed)
Anesthesia Post Note  Patient: Designer, [name]  Procedure(s) Performed: Procedure(s) (LRB): CESAREAN SECTION (N/A)  Patient location during evaluation: Mother Baby Anesthesia Type: Combined Spinal/Epidural Level of consciousness: awake and awake and alert Pain management: pain level controlled Vital Signs Assessment: post-procedure vital signs reviewed and stable Respiratory status: spontaneous breathing Cardiovascular status: stable Postop Assessment: no headache, no backache, spinal receding, epidural receding and patient able to bend at knees Anesthetic complications: no        Last Vitals:  Vitals:   11/21/16 0100 11/21/16 0444  BP: (!) 103/48 130/62  Pulse: 65 65  Resp: 16 18  Temp: 36.7 C 37 C    Last Pain:  Vitals:   11/21/16 0536  TempSrc:   PainSc: 3    Pain Goal: Patients Stated Pain Goal: 3 (11/20/16 1830)               Edison PaceWILKERSON,Nathanyl Andujo

## 2016-11-21 NOTE — Progress Notes (Signed)
Notified OB residents of small round area of redness with small pustule on both legs with itching. Resident to assess at pt's bedside.

## 2016-11-21 NOTE — Clinical Social Work Maternal (Signed)
  CLINICAL SOCIAL WORK MATERNAL/CHILD NOTE  Patient Details  Name: Renee Dennis MRN: [id number] Date of Birth: 08/02/1981  Date:  11/21/2016  Clinical Social Worker Initiating Note:  Terri Piedra, [place] Date/ Time Initiated:  11/21/16/1300     Child's Name:  Renee Dennis   Legal Guardian:  Other (Comment) (Parents: Lorenza Burton and Feliz Beam)   Need for Interpreter:  None   Date of Referral:  11/21/16     Reason for Referral:  Late or No Prenatal Care    Referral Source:  [facility]   Address:  [address] [place], [place] [address]  Phone number:  [phone]   Household Members:  Significant Other, Minor Children (MOB reports that she has 3 other children at home, ages 88, 18, and 2)   Natural Supports (not living in the home):  Friends, Immediate Family, Extended Family (Parents report that they have a good support system.  MGM here visiting today.)   Professional Supports: None   Employment:     Type of Work:     Education:      Pensions consultant:   (Medicaid Pending)   Other Resources:      Cultural/Religious Considerations Which May Impact Care: None stated.  Strengths:  Ability to meet basic needs , Home prepared for child    Risk Factors/Current Problems:  None   Cognitive State:  Able to Concentrate , Alert , Linear Thinking    Mood/Affect:  Euthymic , Calm , Comfortable    CSW Assessment: CSW met with parents in MOB's first floor room/145 to offer support and complete assessment due to [facility].  MGM was present and MOB gave permission to speak openly with MGM and FOB in the room.  CSW found parents to be pleasant and easy to engage. MOB was attending to baby as we spoke.  Bonding is evident.  She reports that labor and delivery went well and that she is feeling well.  She reports no questions, concerns, or needs at this time. MOB denies any hx of PMADs after other deliveries.  She was attentive as CSW reviewed signs and symptoms  and encouraged her to call her MD if she has concerns about her emotions at any time.   CSW inquired about her [facility] and informed parents of hospital drug screen policy and mandated reporting.  MOB states she started care at 33 weeks because she was "getting the run around" from various OB offices.  She also states that she has applied for Medicaid, but reports that it is still pending, which made it difficult for her to get an appointment.  She reports no concerns with drug screen policy and was understanding.  CSW Plan/Description:  No Further Intervention Required/No Barriers to Discharge, Patient/Family Education     Alphonzo Cruise, [place] 11/21/2016, 4:15 PM

## 2016-11-21 NOTE — Addendum Note (Signed)
Addendum  created 11/21/16 [id number] by Earmon PhoenixValerie P Ishan Sanroman, CRNA   Sign clinical note

## 2016-11-21 NOTE — Lactation Note (Signed)
This note was copied from a baby's chart. Lactation Consultation Note  Patient Name: Boy Luellen PuckerJamiel Ryant ZOXWR'UToday's Date: 11/21/2016 Reason for consult: Follow-up assessment Baby at 27 hr of life. Mom is in pain. She reports bf on the R breast is going well but baby is having a hard time latching deeply to the L. She stated baby just ate but she will call for latch help at the next feeding.   Maternal Data    Feeding Feeding Type: Breast Fed Length of feed: 5 min  LATCH Score/Interventions Latch: Repeated attempts needed to sustain latch, nipple held in mouth throughout feeding, stimulation needed to elicit sucking reflex. Intervention(s): Adjust position;Assist with latch  Audible Swallowing: A few with stimulation Intervention(s): Skin to skin  Type of Nipple: Everted at rest and after stimulation  Comfort (Breast/Nipple): Filling, red/small blisters or bruises, mild/mod discomfort  Problem noted: Mild/Moderate discomfort  Hold (Positioning): No assistance needed to correctly position infant at breast. Intervention(s): Support Pillows  LATCH Score: 7  Lactation Tools Discussed/Used     Consult Status Consult Status: Follow-up Date: 11/21/16 Follow-up type: In-patient    Rulon Eisenmengerlizabeth E Derrica Sieg 11/21/2016, 6:02 PM

## 2016-11-22 MED ORDER — OXYCODONE HCL 5 MG PO TABS: 5.0000 mg | ORAL_TABLET | ORAL | 0 refills | Status: DC | PRN

## 2016-11-22 NOTE — Discharge Instructions (Signed)
Storing Breast Milk Breast milk is a living fluid that contains infection-fighting cells (antibodies). Pre-pumped (expressed) breast milk needs to be stored in a certain way so that it remains effective in protecting your baby against infections. The following guidelines are for storing breast milk for a healthy, full-term infant. How long can breast milk be stored?  Milk can be stored for up to 4 hours at room temperature, or 60-69F (15.6-19.4C). However, it is acceptable to allow the milk to sit for 6-8 hours if the pump parts and containers are well cleaned.  Milk can be stored for 3 days in a refrigerator at less than 38F (3.9C). However, it is acceptable to allow the milk to sit for up to 8 days if the pump parts and containers are well cleaned.  Milk can be stored for 2 weeks in a freezer compartment inside a refrigerator.  Milk can be stored for 3-6 months in a freezer unit with a separate door.  Milk can be stored for 6-12 months in a deep freezer at -61F (-20C). A deep freezer is a chest or stand-alone freezer that is not opened very often and is kept at a colder temperature than a regular freezer. How should I store breast milk?  Milk may be stored in:  A glass container.  A hard, BPA-free plastic container.  A plastic bag specially designed for storing breast milk. Many women like these because they take up less space than other containers and can be attached directly to the breast pump.  Store your milk in 2-4 oz. (60-120 mL) servings. This makes it easier to thaw the milk. It also helps you avoid having to throw out milk that your baby does not drink.  Leave an inch or so at the top of the bag or bottle so the milk has room to expand as it freezes.  Label each container with the date and time the milk was pumped so that you use the milk in the order that it was pumped.  If you will be freezing the milk, freeze it within 24 hours of pumping. Store it in the back of the  freezer to prevent the milk from being affected by temperature changes when the freezer door is opened. How do I thaw frozen breast milk?  Frozen milk can be thawed:  In a refrigerator.  Under warm, running tap water.  In a pan of warm water that has been heated on the stove.  Do not heat milk directly on the stove or in the microwave. The milk will heat unevenly, and it may burn the baby. It will also destroy some of the milks infection-fighting properties.  Thawed milk can be stored in the refrigerator for up to 24 hours, but it should not be refrozen.  If you want to freeze freshly pumped milk along with milk that is already frozen, take these steps to prevent the fresh milk from thawing out the frozen milk:  Chill the fresh milk in the refrigerator for 30 minutes before adding it to the frozen milk.  Make sure that the amount of fresh milk you add is smaller than the amount of frozen milk. This information is not intended to replace advice given to you by your health care provider. Make sure you discuss any questions you have with your health care provider. Document Released: 09/16/2009 Document Revised: 07/19/2016 Document Reviewed: 06/20/2016 Elsevier Interactive Patient Education  2017 [employer] Postpartum Care After Cesarean Delivery The period of time right after  you deliver your newborn is called the postpartum period. What kind of medical care will I receive?  You may continue to receive fluids and medicines through an IV tube inserted into one of your veins.  You may have small, flexible tube (catheter) draining urine from your bladder into a bag outside of your body. The catheter will be removed as soon as possible.  You may be given a squirt bottle to use when you go to the bathroom. You may use this until you are comfortable wiping as usual. To use the squirt bottle, follow these steps:  Before you urinate, fill the squirt bottle with warm water. The water should  be warm. Do not use hot water.  After you urinate, while you are sitting on the toilet, use the squirt bottle to rinse the area around your urethra and vaginal opening. This rinses away any urine and blood.  You may do this instead of wiping. As you start healing, you may use the squirt bottle before wiping yourself. Make sure to wipe gently.  Fill the squirt bottle with clean water every time you use the bathroom.  You will be given sanitary pads to wear.  Your incision will be monitored to make sure it is healing properly. You will be told when it is safe for your stitches, staples, or skin adhesive tape to be removed. What can I expect?  You may not feel the need to urinate for several hours after delivery.  You will have some soreness and pain in your abdomen. You may have a small amount of blood or clear fluid coming from your incision.  If you are breastfeeding, you may have uterine contractions every time you breastfeed for up to several weeks postpartum. Uterine contractions help your uterus return to its normal size.  It is normal to have vaginal bleeding (lochia) after delivery. The amount and appearance of lochia is often similar to a menstrual period in the first week after delivery. It will gradually decrease over the next few weeks to a dry, yellow-brown discharge. For most women, lochia stops completely by 6-8 weeks after delivery. Vaginal bleeding can vary from woman to woman.  Within the first few days after delivery, you may have breast engorgement. This is when your breasts feel heavy, full, and uncomfortable. Your breasts may also throb and feel hard, tightly stretched, warm, and tender. After this occurs, you may have milk leaking from your breasts.Your health care provider can help you relieve discomfort due to breast engorgement. Breast engorgement should go away within a few days.  You may feel more sad or worried than normal due to hormonal changes after delivery.  These feelings should not last more than a few days. If these feelings do not go away after several days, speak with your health care provider. How should I care for myself?  Tell your health care provider if you have pain or discomfort.  Drink enough water to keep your urine clear or pale yellow.  Wash your hands thoroughly with soap and water for at least 20 seconds after changing your sanitary pads or using the toilet, and before holding or feeding your baby.  If you are not breastfeeding, avoid touching your breasts a lot. Doing this can make your breasts produce more milk.  If you become weak or lightheaded, or you feel like you might faint, ask for help before:  Getting out of bed.  Showering.  Change your sanitary pads frequently. Watch for any changes in  your flow, such as a sudden increase in volume, a change in color, or the passing of large blood clots. If you pass a blood clot from your vagina, save it to show to your health care provider. Do not flush blood clots down the toilet without having your health care provider look at them.  Make sure that all your vaccinations are up to date. This can help protect you and your baby from getting certain diseases. You may need to have immunizations done before you leave the hospital.  If desired, talk with your health care provider about methods of family planning or birth control (contraception). How can I start bonding with my baby? Spending as much time as possible with your baby is very important. During this time, you and your baby can get to know each other and develop a bond. Having your baby stay with you in your room (rooming in) can give you time to get to know your baby. Rooming in can also help you become comfortable caring for your baby. Breastfeeding can also help you bond with your baby. How can I plan for returning home with my baby?  Make sure that you have a car seat installed in your vehicle.  Your car seat should  be checked by a certified car seat installer to make sure that it is installed safely.  Make sure that your baby fits into the car seat safely.  Ask your health care provider any questions you have about caring for yourself or your baby. Make sure that you are able to contact your health care provider with any questions after leaving the hospital. This information is not intended to replace advice given to you by your health care provider. Make sure you discuss any questions you have with your health care provider. Document Released: 08/13/2012 Document Revised: 04/23/2016 Document Reviewed: 10/24/2015 Elsevier Interactive Patient Education  2017 [employer]

## 2016-11-22 NOTE — Progress Notes (Signed)
Dr. Omer JackMumaw notifed of rash on left leg and stated to do hot compresses. DC teaching completed.

## 2016-11-22 NOTE — Discharge Summary (Signed)
    OB Discharge Summary     Patient Name: Renee Dennis DOB: January 10, 1981 MRN: [id number]  Date of admission: 11/20/2016 Delivering MD: Weir BingPICKENS, CHARLIE   Date of discharge: 11/22/2016  Admitting diagnosis: REPEAT C SECTION Intrauterine pregnancy: 1252w1d     Secondary diagnosis:  Active Problems:   Status post repeat low transverse cesarean section  Additional problems: previa     Discharge diagnosis: Preterm Pregnancy Delivered                                                                                                Post partum procedures:postpartum tubal ligation  Augmentation: AROM  Complications: None  Hospital course:  Sceduled C/S   35 y.o. yo [id number] at 7752w1d was admitted to the hospital 11/20/2016 for scheduled cesarean section with the following indication:Previa.  Membrane Rupture Time/Date: 2:40 PM ,11/20/2016   Patient delivered a Viable infant.11/20/2016  Details of operation can be found in separate operative note.  Pateint had an uncomplicated postpartum course.  She is ambulating, tolerating a regular diet, passing flatus, and urinating well. Patient is discharged home in stable condition on  11/22/16          Physical exam Vitals:   11/21/16 0915 11/21/16 1400 11/21/16 1732 11/22/16 0538  BP: 120/70 115/62 (!) 149/18 130/69  Pulse: 75 93 92 89  Resp: 16 18 18 18   Temp: 98.7 F (37.1 C) 98.8 F (37.1 C) 98.4 F (36.9 C) 98.2 F (36.8 C)  TempSrc: Oral Oral Oral Oral  SpO2: 100% 100%     General: alert, cooperative and no distress Lochia: appropriate Uterine Fundus: firm Incision: Healing well with no significant drainage, No significant erythema, Dressing is clean, dry, and intact DVT Evaluation: No evidence of DVT seen on physical exam. Labs: Lab Results  Component Value Date   WBC 8.2 11/21/2016   HGB 9.5 (L) 11/21/2016   HCT 29.3 (L) 11/21/2016   MCV 76.1 (L) 11/21/2016   PLT 207 11/21/2016   No flowsheet data found.  Discharge  instruction: per After Visit Summary and "Baby and Me Booklet".  After visit meds:  Allergies as of 11/22/2016      Reactions   Asa [aspirin] Hives, Swelling   Per pt, she tolerates Ibuprofen   Mushroom Extract Complex Hives, Swelling      Medication List    TAKE these medications   prenatal multivitamin Tabs tablet Take 1 tablet by mouth daily at 12 noon.       Diet: routine diet  Activity: Advance as tolerated. Pelvic rest for 6 weeks.   Outpatient follow up:6 weeks Follow up Appt:No future appointments. Follow up Visit:No Follow-up on file.  Postpartum contraception: Tubal Ligation  Newborn Data: Live born female  Birth Weight: 6 lb 14.4 oz (3130 g) APGAR: 9, 9  Baby Feeding: Breast Disposition:home with mother   11/22/2016 Clearance CootsAndrew Tyson, MD  OB FELLOW DISCHARGE ATTESTATION  I have seen and examined this patient and agree with above documentation in the resident's note.   Jen MowElizabeth Leonarda Leis, DO OB Fellow

## 2016-11-22 NOTE — Lactation Note (Signed)
This note was copied from a baby's chart. Lactation Consultation Note  Patient Name: Renee Dennis URKYH'C Date: 11/22/2016 Reason for consult: Infant weight loss;Follow-up assessment - 8% weight loss , early D/C.  Baby 69 hours old and per mom plans to breast and formula,  Which she has already been supplementing.  Per mom baby recently breast fed at 1200 15 mins and supplemented 30 ml after wards.  Mom and baby ready for D/C and baby already in the car seat.  Per mom active with [facility] and plans  to call for a DEBP - LC will send a [facility] referral today. LC instructed mom on the use hand pump and DEBP kit given for when she obtains her WIC pump. LC discussed with  Mom since this is her 3 rd baby and she is an experienced BF her volume and let - down can be greater. Therefore LC recommended if the 1st breast is full, hand express off enough milk so the baby can latch with depth, have him feed long enough to soften the 1st breast well ( enhance weight gain ), and supplement afterwards until baby is gaining 30 ml , release 2nd breast down , save milk and feed it back for supplement.  LC also reminded mom to call [facility] today for a DEBP loaner.  Sore nipple and engorgement prevention and tx reviewed.  Mom in mixed company when [facility] showed her the set up of the hand pump.  LC unable to check flange due to mixed company , so LC gave mom #27 in case the #24 F is to tight.  Mother informed of post-discharge support and given phone number to the lactation department, including services for phone call assistance; out-patient appointments; and breastfeeding support group. List of other breastfeeding resources in the community given in the handout. Encouraged mother to call for problems or concerns related to breastfeeding.    Maternal Data    Feeding Feeding Type: Breast Fed Length of feed: 15 min  LATCH Score/Interventions                Intervention(s): Breastfeeding basics reviewed     Lactation Tools Discussed/Used WIC Program: Yes   Consult Status Consult Status: Complete Date: 11/22/16    Myer Haff 11/22/2016, 12:32 PM

## 2016-11-22 NOTE — Progress Notes (Signed)
Dr. Genevie AnnSchenk notified of honeycomb dressing being completely saturated. Dressing changed per order. No drainage noted . 3 steri- strips reapplied due to saturation, other strips intact. Dr. Genevie AnnSchenk stated that if the incision was not drainging that is was ok to let patient go home. DC teaching completed.

## 2016-11-23 LAB — TYPE AND SCREEN
ABO/RH(D): O POS
Antibody Screen: NEGATIVE
UNIT DIVISION: 0
UNIT DIVISION: 0

## 2016-12-06 ENCOUNTER — Encounter: Payer: Self-pay | Admitting: Obstetrics and Gynecology

## 2016-12-10 ENCOUNTER — Ambulatory Visit (HOSPITAL_COMMUNITY): Payer: Self-pay

## 2016-12-31 ENCOUNTER — Encounter: Payer: Self-pay | Admitting: Obstetrics and Gynecology

## 2016-12-31 ENCOUNTER — Ambulatory Visit (INDEPENDENT_AMBULATORY_CARE_PROVIDER_SITE_OTHER): Payer: Self-pay | Admitting: Obstetrics and Gynecology

## 2016-12-31 VITALS — BP 121/63 | HR 70 | Ht 63.0 in | Wt 296.6 lb

## 2016-12-31 DIAGNOSIS — A530 Latent syphilis, unspecified as early or late: Secondary | ICD-10-CM

## 2016-12-31 NOTE — Progress Notes (Signed)
Subjective:     Renee Dennis is a 36 y.o. female who presents for a postpartum visit. She is 6 weeks postpartum following a low cervical transverse Cesarean section. I have fully reviewed the prenatal and intrapartum course. The delivery was at 37 gestational weeks. Outcome: repeat cesarean section, low transverse incision. Anesthesia: spinal. Postpartum course has been uncomplicated. Baby's course has been uncomplicated. Baby is feeding by both breast and bottle - Similac Advance. Bleeding no bleeding. Bowel function is normal. Bladder function is normal. Patient is not sexually active. Contraception method is tubal ligation. Postpartum depression screening: negative.  Positive RPR on 12/18: Quant 1:1, TPA: negative. Will repeat today.   The following portions of the patient's history were reviewed and updated as appropriate: allergies, current medications, past family history, past medical history, past social history, past surgical history and problem list.  Review of Systems Pertinent items are noted in HPI.   Objective:    BP 121/63   Pulse 70   Ht 5\' 3"  (1.6 m)   Wt 296 lb 9.6 oz (134.5 kg)   Breastfeeding? Yes   BMI 52.54 kg/m   General:  alert, cooperative and appears stated age   Breasts:  inot evaluted today.  Lungs: clear to auscultation bilaterally  Heart:  regular rate and rhythm, S1, S2 normal, no murmur, click, rub or gallop  Abdomen: soft, non-tender; bowel sounds normal; no masses,  no organomegaly. Incision approximated. No odor, leaking or erythema.    Vulva:  not evaluated  Vagina: not evaluated  Cervix:  not evaluated   Corpus: not examined  Adnexa:  not evaluated  Rectal Exam: Not performed.        Assessment:  -Normal postpartum exam. Pap smear not done at today's visit. Last pap done on 10/22/16; WNL  -RPR today   Plan:   1. Contraception: tubal ligation 2. Patient called on 1/31 to notify her of negative RPR. Left message to have patient return my  call.  3. Follow up in: as needed    Duane LopeJennifer I Rasch, NP

## 2017-01-01 LAB — RPR

## 2017-01-03 ENCOUNTER — Telehealth (HOSPITAL_COMMUNITY): Payer: Self-pay | Admitting: *Deleted
